# Patient Record
Sex: Male | Born: 1990 | Race: White | Hispanic: No | Marital: Single | Smoking: Current every day smoker
Health system: Southern US, Community
[De-identification: ages and names within clinical notes are randomized; demographics above are authoritative.]

## PROBLEM LIST (undated history)

## (undated) DIAGNOSIS — F319 Bipolar disorder, unspecified: Secondary | ICD-10-CM

## (undated) DIAGNOSIS — F909 Attention-deficit hyperactivity disorder, unspecified type: Secondary | ICD-10-CM

## (undated) DIAGNOSIS — F79 Unspecified intellectual disabilities: Secondary | ICD-10-CM

## (undated) HISTORY — PX: NO PAST SURGERIES: SHX2092

## (undated) HISTORY — PX: FOOT SURGERY: SHX648

---
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
## 2013-06-10 ENCOUNTER — Ambulatory Visit (HOSPITAL_COMMUNITY)
Admission: RE | Admit: 2013-06-10 | Discharge: 2013-06-10 | Disposition: A | Discharge status: Home / Self Care | Payer: Medicaid Other | Source: Ambulatory Visit | Attending: Internal Medicine | Admitting: Internal Medicine

## 2013-06-10 ENCOUNTER — Other Ambulatory Visit (HOSPITAL_COMMUNITY): Payer: Self-pay | Admitting: Internal Medicine

## 2013-06-10 DIAGNOSIS — M25569 Pain in unspecified knee: Secondary | ICD-10-CM | POA: Insufficient documentation

## 2013-10-03 ENCOUNTER — Emergency Department (HOSPITAL_COMMUNITY)
Admission: EM | Admit: 2013-10-03 | Discharge: 2013-10-03 | Disposition: A | Discharge status: Home / Self Care | Payer: Medicaid Other | Attending: Emergency Medicine | Admitting: Emergency Medicine

## 2013-10-03 DIAGNOSIS — Y9289 Other specified places as the place of occurrence of the external cause: Secondary | ICD-10-CM | POA: Insufficient documentation

## 2013-10-03 DIAGNOSIS — W1809XA Striking against other object with subsequent fall, initial encounter: Secondary | ICD-10-CM | POA: Insufficient documentation

## 2013-10-03 DIAGNOSIS — S0510XA Contusion of eyeball and orbital tissues, unspecified eye, initial encounter: Secondary | ICD-10-CM | POA: Diagnosis present

## 2013-10-03 DIAGNOSIS — S058X9A Other injuries of unspecified eye and orbit, initial encounter: Secondary | ICD-10-CM | POA: Diagnosis not present

## 2013-10-03 DIAGNOSIS — S0502XA Injury of conjunctiva and corneal abrasion without foreign body, left eye, initial encounter: Secondary | ICD-10-CM

## 2013-10-03 DIAGNOSIS — R51 Headache: Secondary | ICD-10-CM | POA: Diagnosis not present

## 2013-10-03 DIAGNOSIS — H1130 Conjunctival hemorrhage, unspecified eye: Secondary | ICD-10-CM | POA: Insufficient documentation

## 2013-10-03 DIAGNOSIS — H1132 Conjunctival hemorrhage, left eye: Secondary | ICD-10-CM

## 2013-10-03 DIAGNOSIS — Y9302 Activity, running: Secondary | ICD-10-CM | POA: Diagnosis not present

## 2013-10-03 MED ORDER — OXYCODONE-ACETAMINOPHEN 5-325 MG PO TABS: 2.0000 | ORAL_TABLET | Freq: Once | ORAL | Status: DC

## 2013-10-03 MED ORDER — TRAMADOL HCL 50 MG PO TABS: 50.0000 mg | ORAL_TABLET | Freq: Four times a day (QID) | ORAL | Status: DC | PRN

## 2013-10-03 MED ORDER — TETRACAINE HCL 0.5 % OP SOLN
2.0000 [drp] | Freq: Once | OPHTHALMIC | Status: AC
  Administered 2013-10-03: 2 [drp] via OPHTHALMIC
  Filled 2013-10-03: qty 2

## 2013-10-03 MED ORDER — FLUORESCEIN SODIUM 1 MG OP STRP
1.0000 | ORAL_STRIP | Freq: Once | OPHTHALMIC | Status: AC
  Administered 2013-10-03: 1 via OPHTHALMIC
  Filled 2013-10-03: qty 1

## 2013-10-03 MED ORDER — OXYCODONE-ACETAMINOPHEN 5-325 MG PO TABS
1.0000 | ORAL_TABLET | Freq: Once | ORAL | Status: AC
  Administered 2013-10-03: 1 via ORAL
  Filled 2013-10-03: qty 1

## 2013-10-03 MED ORDER — ERYTHROMYCIN 5 MG/GM OP OINT: TOPICAL_OINTMENT | Freq: Three times a day (TID) | OPHTHALMIC | Status: DC

## 2013-10-03 NOTE — ED Provider Notes (Signed)
CSN: [id number]     Arrival date & time 10/03/13  1223 History  This chart was scribed for non-physician practitioner, Junius Finner, PA-C,working with Gerhard Munch, MD, by Karle Plumber, ED Scribe.  This patient was seen in room TR04C/TR04C and the patient's care was started at 12:36 PM.   Chief Complaint  Patient presents with  . Eye Injury   The history is provided by the patient. No language interpreter was used.   HPI Comments:  Mark [name] is a 23 y.o. male who presents to the Emergency Department complaining of a left eye injury secondary to running into the corner of a car door while washing it PTA. Pt states he fell to the ground. He reports associated aching HA and photophobia of the left eye. He states the pain is not severe. He denies LOC, loss of vision or epistaxis. He denies any allergies to any medications.   No past medical history on file. No past surgical history on file. No family history on file. History  Substance Use Topics  . Smoking status: Not on file  . Smokeless tobacco: Not on file  . Alcohol Use: Not on file    Review of Systems  HENT: Negative for nosebleeds.   Eyes: Positive for photophobia and redness. Negative for visual disturbance.  Neurological: Positive for headaches. Negative for syncope.  All other systems reviewed and are negative.   Allergies  Review of patient's allergies indicates no known allergies.  Home Medications   Prior to Admission medications   Medication Sig Start Date End Date Taking? Authorizing Provider  erythromycin ophthalmic ointment Place into the left eye 3 (three) times daily. Place a 1/2 inch ribbon of ointment into the lower eyelid. For 5 days 10/03/13   Junius Finner, PA-C  traMADol (ULTRAM) 50 MG tablet Take 1 tablet (50 mg total) by mouth every 6 (six) hours as needed. 10/03/13   Junius Finner, PA-C   Triage Vitals: BP 145/72  Pulse 112  Temp(Src) 98.9 F (37.2 C) (Oral)  SpO2 99% Physical Exam   Nursing note and vitals reviewed. Constitutional: He is oriented to person, place, and time. He appears well-developed and well-nourished.  HENT:  Head: Normocephalic and atraumatic.  Eyes: EOM and lids are normal. Pupils are equal, round, and reactive to light. No foreign body present in the left eye. Left conjunctiva has a hemorrhage.  Slit lamp exam:      The left eye shows corneal abrasion and fluorescein uptake. The left eye shows no foreign body.  Neck: Normal range of motion.  Cardiovascular: Normal rate.   Pulmonary/Chest: Effort normal.  Musculoskeletal: Normal range of motion.  Neurological: He is alert and oriented to person, place, and time.  Skin: Skin is warm and dry.  Psychiatric: He has a normal mood and affect. His behavior is normal.    ED Course  Procedures (including critical care time) DIAGNOSTIC STUDIES: Oxygen Saturation is 99% on RA, normal by my interpretation.   COORDINATION OF CARE: 12:38 PM- Will speak with Dr. Jeraldine Loots to determine if imaging is necessary. Pt verbalizes understanding and agrees to plan.  1:38 PM- Spoke with Dr. Dione Booze and he advised to fluorescein dye the eye and check for abrasion/foreign body and then have the pt follow up with him on Monday.  Medications  tetracaine (PONTOCAINE) 0.5 % ophthalmic solution 2 drop (2 drops Left Eye Given 10/03/13 1237)  fluorescein ophthalmic strip 1 strip (1 strip Left Eye Given 10/03/13 1237)  oxyCODONE-acetaminophen (PERCOCET/ROXICET) 5-325  MG per tablet 1 tablet (1 tablet Oral Given 10/03/13 1316)    Labs Review Labs Reviewed - No data to display  Imaging Review No results found.   EKG Interpretation None      MDM   Final diagnoses:  Subconjunctival hemorrhage of left eye  Corneal abrasion, left, initial encounter    Pt presenting to ED with c/o left eye pain after hitting on open window of a car about 20min PTA.  Visual acuity: 20/30 bilaterally.  Pt does wear glasses.  PERRL, EOM in  tact.  subconjunctivial hemorrhage with chemosis, no hyphema.   Discussed pt with Dr. Jeraldine LootsLockwood who also examined pt.  Will consult with ophthalmology to ensure.  1:38 PM- Spoke with Dr. Dione BoozeGroat and he advised to fluorescein dye the eye and check for abrasion/foreign body and then have the pt follow up with him on Monday.  Corneal abrasion present but no leaking of blood or eye fluid.  Will tx with erythromycin ophthalmic ointment. Advised to call to schedule f/u appointment next week with Dr. Dione BoozeGroat. Return precautions provided. Pt verbalized understanding and agreement with tx plan.   I personally performed the services described in this documentation, which was scribed in my presence. The recorded information has been reviewed and is accurate.     Junius Finnerrin O'Malley, PA-C 10/03/13 1447

## 2013-10-03 NOTE — ED Notes (Signed)
Pt reports he ran into a open car door and the pointed corner hit him in the eye. Eye red and swollen

## 2013-10-03 NOTE — ED Provider Notes (Signed)
  This was a shared visit with a mid-level provided (NP or PA).  Throughout the patient's course I was available for consultation/collaboration.    On my exam the patient was in no distress.  He had consistent visual acuity, which is diminished at baseline.  There was evidence for a right chemosis, though no evidence of hyphema.  There was also evidence of mild corneal abrasion, which the patient was started on erythromycin      Gerhard Munchobert Hevin Jeffcoat, MD 10/03/13 [phone]

## 2013-10-03 NOTE — ED Notes (Signed)
Declined W/C at D/C and was escorted to lobby by RN. 

## 2014-04-24 ENCOUNTER — Emergency Department (HOSPITAL_COMMUNITY)
Admission: EM | Admit: 2014-04-24 | Discharge: 2014-04-24 | Disposition: A | Discharge status: Home / Self Care | Payer: Medicaid Other | Attending: Emergency Medicine | Admitting: Emergency Medicine

## 2014-04-24 ENCOUNTER — Encounter (HOSPITAL_COMMUNITY): Payer: Self-pay | Admitting: Emergency Medicine

## 2014-04-24 DIAGNOSIS — Z792 Long term (current) use of antibiotics: Secondary | ICD-10-CM | POA: Insufficient documentation

## 2014-04-24 DIAGNOSIS — Z8659 Personal history of other mental and behavioral disorders: Secondary | ICD-10-CM | POA: Insufficient documentation

## 2014-04-24 DIAGNOSIS — Z720 Tobacco use: Secondary | ICD-10-CM | POA: Insufficient documentation

## 2014-04-24 DIAGNOSIS — L509 Urticaria, unspecified: Secondary | ICD-10-CM | POA: Diagnosis present

## 2014-04-24 DIAGNOSIS — R21 Rash and other nonspecific skin eruption: Secondary | ICD-10-CM | POA: Insufficient documentation

## 2014-04-24 HISTORY — DX: Unspecified intellectual disabilities: F79

## 2014-04-24 HISTORY — DX: Bipolar disorder, unspecified: F31.9

## 2014-04-24 HISTORY — DX: Attention-deficit hyperactivity disorder, unspecified type: F90.9

## 2014-04-24 MED ORDER — DIPHENHYDRAMINE HCL 25 MG PO CAPS
25.0000 mg | ORAL_CAPSULE | Freq: Once | ORAL | Status: AC
  Administered 2014-04-24: 25 mg via ORAL
  Filled 2014-04-24: qty 1

## 2014-04-24 NOTE — ED Notes (Addendum)
Pt. reports generalized itchy skin rashes/hives onset this evening after eating chinese food , airway intact/ respirations unlabored .

## 2014-04-24 NOTE — ED Provider Notes (Signed)
CSN: [id number]     Arrival date & time 04/24/14  2036 History  This chart was scribed for non-physician practitioner, Mark Forth, PA-C, working with Mark Porter, MD, by Mark [name], ED Scribe. This patient was seen in room TR08C/TR08C and the patient's care was started at 9:32 PM.   Chief Complaint  Patient presents with  . Urticaria    The history is provided by the patient and medical records. No language interpreter was used.     HPI Comments: Mark [name] is a 24 y.o. male who presents to the Emergency Department complaining of gradually improving itchy, red rash over the entire body with onset 2-3 hours ago, after eating chinese food. Patient states he ate chinese food approximately 1-2 hours prior to breaking out in the rash and suspects this is to be the cause. He reports his symptoms are now subsiding at this time. He denies exposure to new soaps, detergents, colognes, or lotions. Patient denies history of allergies. He further denies SOB, wheezing, or throat swelling.    Past Medical History  Diagnosis Date  . ADHD (attention deficit hyperactivity disorder)   . Bipolar 1 disorder   . Mental retardation    History reviewed. No pertinent past surgical history. No family history on file. History  Substance Use Topics  . Smoking status: Current Every Day Smoker  . Smokeless tobacco: Not on file  . Alcohol Use: No    Review of Systems  Constitutional: Negative for fever, diaphoresis, appetite change, fatigue and unexpected weight change.  HENT: Negative for facial swelling, mouth sores and trouble swallowing.   Eyes: Negative for visual disturbance.  Respiratory: Negative for cough, chest tightness, shortness of breath and wheezing.   Cardiovascular: Negative for chest pain.  Gastrointestinal: Negative for nausea, vomiting, abdominal pain, diarrhea and constipation.  Musculoskeletal: Negative for back pain.  Skin: Positive for color change and rash.   Allergic/Immunologic: Negative for immunocompromised state.  Neurological: Negative for syncope, light-headedness and headaches.  Hematological: Does not bruise/bleed easily.  Psychiatric/Behavioral: Negative for sleep disturbance. The patient is not nervous/anxious.       Allergies  Review of patient's allergies indicates no known allergies.  Home Medications   Prior to Admission medications   Medication Sig Start Date End Date Taking? Authorizing Provider  erythromycin ophthalmic ointment Place into the left eye 3 (three) times daily. Place a 1/2 inch ribbon of ointment into the lower eyelid. For 5 days 10/03/13   Mark Finner, PA-C  traMADol (ULTRAM) 50 MG tablet Take 1 tablet (50 mg total) by mouth every 6 (six) hours as needed. 10/03/13   Mark Finner, PA-C   Triage Vitals: BP 131/73 mmHg  Pulse 117  Temp(Src) 97.9 F (36.6 C) (Oral)  Resp 18  SpO2 97%  Physical Exam  Constitutional: He is oriented to person, place, and time. He appears well-developed and well-nourished. No distress.  HENT:  Head: Normocephalic and atraumatic.  Right Ear: Tympanic membrane, external ear and ear canal normal.  Left Ear: Tympanic membrane, external ear and ear canal normal.  Nose: Nose normal. No mucosal edema or rhinorrhea.  Mouth/Throat: Uvula is midline. No uvula swelling. No oropharyngeal exudate, posterior oropharyngeal edema, posterior oropharyngeal erythema or tonsillar abscesses.  No swelling of the uvula or oropharynx   Eyes: Conjunctivae are normal.  Neck: Normal range of motion.  Patent airway No stridor; normal phonation Handling secretions without difficulty  Cardiovascular: Normal rate, normal heart sounds and intact distal pulses.   No murmur heard.  Pulmonary/Chest: Effort normal and breath sounds normal. No stridor. No respiratory distress. He has no wheezes.  No wheezes or rhonchi  Abdominal: Soft. Bowel sounds are normal. There is no tenderness.  Musculoskeletal:  Normal range of motion. He exhibits no edema.  Neurological: He is alert and oriented to person, place, and time.  Skin: Skin is warm and dry. He is not diaphoretic.  No rash  Psychiatric: He has a normal mood and affect.  Nursing note and vitals reviewed.   ED Course  Procedures (including critical care time)  DIAGNOSTIC STUDIES: Oxygen Saturation is 97% on room air, adequate by my interpretation.    COORDINATION OF CARE: At 2139 Discussed treatment plan with patient which includes Benadryl. Patient agrees.   Labs Review Labs Reviewed - No data to display  Imaging Review No results found.   EKG Interpretation None      MDM   Final diagnoses:  Urticaria   Mark [name] presents for allergic reaction to Cong[place] food.  Pt is from a group home and chaperone reports a red, blotchy rash about 8PM.  On physical exam, pt is without rash anywhere on his body.  Pt given Benadryl 25mg  here in the ED.  Patient is hemodynamically stable, in no respiratory distress, and denies the feeling of throat closing. Pt has been advised to take OTC benadryl & return to the ED if they have a mod-severe allergic rxn (s/s including throat closing, difficulty breathing, swelling of lips face or tongue). Pt is to follow up with their PCP. Pt is agreeable with plan & verbalizes understanding.   I have personally reviewed patient's vitals, nursing note and any pertinent labs or imaging.  I performed an focused physical exam; undressed when appropriate .    It has been determined that no acute conditions requiring further emergency intervention are present at this time. The patient/guardian have been advised of the diagnosis and plan. I reviewed any labs and imaging including any potential incidental findings. We have discussed signs and symptoms that warrant return to the ED and they are listed in the discharge instructions.    Vital signs are stable at discharge.  Tachycardia in triage, but no tachycardia  on my exam.  BP 131/73 mmHg  Pulse 117  Temp(Src) 97.9 F (36.6 C) (Oral)  Resp 18  SpO2 97%   I personally performed the services described in this documentation, which was scribed in my presence. The recorded information has been reviewed and is accurate.       Dahlia ClientHannah Arvella Massingale, PA-C 04/25/14 0006  Mark PorterMark James, MD 04/25/14 2245

## 2014-04-24 NOTE — ED Notes (Signed)
Pt st's symptoms are subsiding at this time.  No swelling present, no resp. Problems .

## 2014-04-24 NOTE — Discharge Instructions (Signed)
1. Medications: Benadryl as needed if rash returns, usual home medications 2. Treatment: rest, drink plenty of fluids, take medications as prescribed 3. Follow Up: Please followup with your primary doctor in 3 days for discussion of your diagnoses and further evaluation after today's visit; if you do not have a primary care doctor use the resource guide provided to find one; followup with dermatology as needed; Return to the ER for difficulty breathing, return of allergic reaction or other concerning symptoms   Hives Hives are itchy, red, swollen areas of the skin. They can vary in size and location on your body. Hives can come and go for hours or several days (acute hives) or for several weeks (chronic hives). Hives do not spread from person to person (noncontagious). They may get worse with scratching, exercise, and emotional stress. CAUSES   Allergic reaction to food, additives, or drugs.  Infections, including the common cold.  Illness, such as vasculitis, lupus, or thyroid disease.  Exposure to sunlight, heat, or cold.  Exercise.  Stress.  Contact with chemicals. SYMPTOMS   Red or white swollen patches on the skin. The patches may change size, shape, and location quickly and repeatedly.  Itching.  Swelling of the hands, feet, and face. This may occur if hives develop deeper in the skin. DIAGNOSIS  Your caregiver can usually tell what is wrong by performing a physical exam. Skin or blood tests may also be done to determine the cause of your hives. In some cases, the cause cannot be determined. TREATMENT  Mild cases usually get better with medicines such as antihistamines. Severe cases may require an emergency epinephrine injection. If the cause of your hives is known, treatment includes avoiding that trigger.  HOME CARE INSTRUCTIONS   Avoid causes that trigger your hives.  Take antihistamines as directed by your caregiver to reduce the severity of your hives. Non-sedating or  low-sedating antihistamines are usually recommended. Do not drive while taking an antihistamine.  Take any other medicines prescribed for itching as directed by your caregiver.  Wear loose-fitting clothing.  Keep all follow-up appointments as directed by your caregiver. SEEK MEDICAL CARE IF:   You have persistent or severe itching that is not relieved with medicine.  You have painful or swollen joints. SEEK IMMEDIATE MEDICAL CARE IF:   You have a fever.  Your tongue or lips are swollen.  You have trouble breathing or swallowing.  You feel tightness in the throat or chest.  You have abdominal pain. These problems may be the first sign of a life-threatening allergic reaction. Call your local emergency services (911 in [place] MAKE SURE YOU:   Understand these instructions.  Will watch your condition.  Will get help right away if you are not doing well or get worse. Document Released: 02/27/2005 Document Revised: 03/04/2013 Document Reviewed: 05/23/2011 [facility] Patient Information 2015 [employer] [place]. This information is not intended to replace advice given to you by your health care provider. Make sure you discuss any questions you have with your health care provider.

## 2014-09-03 ENCOUNTER — Emergency Department (HOSPITAL_COMMUNITY): Payer: Medicaid Other

## 2014-09-03 ENCOUNTER — Inpatient Hospital Stay (HOSPITAL_COMMUNITY)
Admission: EM | Admit: 2014-09-03 | Discharge: 2014-09-04 | DRG: 948 | Disposition: A | Discharge status: Home / Self Care | Payer: Medicaid Other | Attending: Internal Medicine | Admitting: Internal Medicine

## 2014-09-03 ENCOUNTER — Encounter (HOSPITAL_COMMUNITY): Payer: Self-pay | Admitting: Cardiology

## 2014-09-03 DIAGNOSIS — R400 Somnolence: Secondary | ICD-10-CM | POA: Diagnosis not present

## 2014-09-03 DIAGNOSIS — R410 Disorientation, unspecified: Secondary | ICD-10-CM

## 2014-09-03 DIAGNOSIS — Z91018 Allergy to other foods: Secondary | ICD-10-CM

## 2014-09-03 DIAGNOSIS — F17210 Nicotine dependence, cigarettes, uncomplicated: Secondary | ICD-10-CM | POA: Diagnosis present

## 2014-09-03 DIAGNOSIS — R7989 Other specified abnormal findings of blood chemistry: Secondary | ICD-10-CM | POA: Diagnosis present

## 2014-09-03 DIAGNOSIS — E872 Acidosis: Secondary | ICD-10-CM | POA: Diagnosis not present

## 2014-09-03 DIAGNOSIS — F319 Bipolar disorder, unspecified: Secondary | ICD-10-CM | POA: Diagnosis present

## 2014-09-03 DIAGNOSIS — G9340 Encephalopathy, unspecified: Secondary | ICD-10-CM

## 2014-09-03 DIAGNOSIS — F71 Moderate intellectual disabilities: Secondary | ICD-10-CM | POA: Diagnosis present

## 2014-09-03 DIAGNOSIS — R4182 Altered mental status, unspecified: Principal | ICD-10-CM

## 2014-09-03 DIAGNOSIS — F909 Attention-deficit hyperactivity disorder, unspecified type: Secondary | ICD-10-CM | POA: Diagnosis present

## 2014-09-03 DIAGNOSIS — D72829 Elevated white blood cell count, unspecified: Secondary | ICD-10-CM | POA: Diagnosis present

## 2014-09-03 LAB — I-STAT CG4 LACTIC ACID, ED
Lactic Acid, Venous: 4.38 mmol/L (ref 0.5–2.0)
Lactic Acid, Venous: 4.57 mmol/L (ref 0.5–2.0)

## 2014-09-03 LAB — PROTIME-INR
INR: 1.21 (ref 0.00–1.49)
Prothrombin Time: 15.5 seconds — ABNORMAL HIGH (ref 11.6–15.2)

## 2014-09-03 LAB — URINALYSIS, ROUTINE W REFLEX MICROSCOPIC
Glucose, UA: NEGATIVE mg/dL
Hgb urine dipstick: NEGATIVE
Ketones, ur: 15 mg/dL — AB
Leukocytes, UA: NEGATIVE
Nitrite: NEGATIVE
Protein, ur: NEGATIVE mg/dL
Specific Gravity, Urine: 1.028 (ref 1.005–1.030)
Urobilinogen, UA: 1 mg/dL (ref 0.0–1.0)
pH: 6 (ref 5.0–8.0)

## 2014-09-03 LAB — RAPID URINE DRUG SCREEN, HOSP PERFORMED
Amphetamines: NOT DETECTED
Barbiturates: NOT DETECTED
Benzodiazepines: NOT DETECTED
Cocaine: NOT DETECTED
Opiates: NOT DETECTED
Tetrahydrocannabinol: POSITIVE — AB

## 2014-09-03 LAB — COMPREHENSIVE METABOLIC PANEL
ALT: 29 U/L (ref 17–63)
AST: 26 U/L (ref 15–41)
Albumin: 3.9 g/dL (ref 3.5–5.0)
Alkaline Phosphatase: 49 U/L (ref 38–126)
Anion gap: 11 (ref 5–15)
BUN: 11 mg/dL (ref 6–20)
CO2: 20 mmol/L — ABNORMAL LOW (ref 22–32)
Calcium: 9.5 mg/dL (ref 8.9–10.3)
Chloride: 107 mmol/L (ref 101–111)
Creatinine, Ser: 0.97 mg/dL (ref 0.61–1.24)
GFR calc Af Amer: >60 mL/min (ref >60)
GFR calc non Af Amer: >60 mL/min (ref >60)
Glucose, Bld: 179 mg/dL — ABNORMAL HIGH (ref 65–99)
Potassium: 3.5 mmol/L (ref 3.5–5.1)
Sodium: 138 mmol/L (ref 135–145)
Total Bilirubin: 0.5 mg/dL (ref 0.3–1.2)
Total Protein: 7 g/dL (ref 6.5–8.1)

## 2014-09-03 LAB — CBC WITH DIFFERENTIAL/PLATELET
Basophils Absolute: 0.1 10*3/uL (ref 0.0–0.1)
Basophils Relative: 1 % (ref 0–1)
Eosinophils Absolute: 0.2 10*3/uL (ref 0.0–0.7)
Eosinophils Relative: 2 % (ref 0–5)
HCT: 48.9 % (ref 39.0–52.0)
Hemoglobin: 17.2 g/dL — ABNORMAL HIGH (ref 13.0–17.0)
Lymphocytes Relative: 30 % (ref 12–46)
Lymphs Abs: 3.8 10*3/uL (ref 0.7–4.0)
MCH: 31.2 pg (ref 26.0–34.0)
MCHC: 35.2 g/dL (ref 30.0–36.0)
MCV: 88.7 fL (ref 78.0–100.0)
Monocytes Absolute: 1.1 10*3/uL — ABNORMAL HIGH (ref 0.1–1.0)
Monocytes Relative: 9 % (ref 3–12)
Neutro Abs: 7.6 10*3/uL (ref 1.7–7.7)
Neutrophils Relative %: 58 % (ref 43–77)
Platelets: 233 10*3/uL (ref 150–400)
RBC: 5.51 MIL/uL (ref 4.22–5.81)
RDW: 12.5 % (ref 11.5–15.5)
WBC: 12.8 10*3/uL — ABNORMAL HIGH (ref 4.0–10.5)

## 2014-09-03 LAB — TSH
TSH: 0.954 u[IU]/mL (ref 0.350–4.500)
TSH: 0.713 u[IU]/mL (ref 0.350–4.500)

## 2014-09-03 LAB — AMMONIA
Ammonia: 49 umol/L — ABNORMAL HIGH (ref 9–35)
Ammonia: 46 umol/L — ABNORMAL HIGH (ref 9–35)

## 2014-09-03 LAB — ETHANOL: Alcohol, Ethyl (B): <5 mg/dL (ref <5)

## 2014-09-03 LAB — I-STAT TROPONIN, ED: Troponin i, poc: 0 ng/mL (ref 0.00–0.08)

## 2014-09-03 LAB — MRSA PCR SCREENING: MRSA by PCR: NEGATIVE

## 2014-09-03 LAB — VALPROIC ACID LEVEL: Valproic Acid Lvl: 93 ug/mL (ref 50.0–100.0)

## 2014-09-03 MED ORDER — SODIUM CHLORIDE 0.9 % IV BOLUS (SEPSIS)
1000.0000 mL | Freq: Once | INTRAVENOUS | Status: AC
  Administered 2014-09-03: 1000 mL via INTRAVENOUS

## 2014-09-03 MED ORDER — DEXTROSE 5 % IV SOLN
10.0000 mg/kg | Freq: Once | INTRAVENOUS | Status: AC
  Administered 2014-09-03: 820 mg via INTRAVENOUS
  Filled 2014-09-03: qty 16.4

## 2014-09-03 MED ORDER — ACETAMINOPHEN 650 MG RE SUPP: 650.0000 mg | Freq: Four times a day (QID) | RECTAL | Status: DC | PRN

## 2014-09-03 MED ORDER — CEFTRIAXONE SODIUM IN DEXTROSE 40 MG/ML IV SOLN
2.0000 g | Freq: Two times a day (BID) | INTRAVENOUS | Status: DC
Start: 2014-09-04 — End: 2014-09-04
  Administered 2014-09-04: 2 g via INTRAVENOUS
  Filled 2014-09-03 (×3): qty 50

## 2014-09-03 MED ORDER — VANCOMYCIN HCL 10 G IV SOLR
2000.0000 mg | Freq: Once | INTRAVENOUS | Status: AC
  Administered 2014-09-03: 2000 mg via INTRAVENOUS
  Filled 2014-09-03: qty 2000

## 2014-09-03 MED ORDER — DEXAMETHASONE SODIUM PHOSPHATE 10 MG/ML IJ SOLN
15.0000 mg | Freq: Once | INTRAMUSCULAR | Status: AC
  Administered 2014-09-03: 15 mg via INTRAVENOUS
  Filled 2014-09-03: qty 2

## 2014-09-03 MED ORDER — ONDANSETRON HCL 4 MG/2ML IJ SOLN: 4.0000 mg | Freq: Four times a day (QID) | INTRAMUSCULAR | Status: DC | PRN

## 2014-09-03 MED ORDER — LIDOCAINE HCL (PF) 1 % IJ SOLN
INTRAMUSCULAR | Status: AC
  Administered 2014-09-03: 16:00:00
  Filled 2014-09-03: qty 5

## 2014-09-03 MED ORDER — SODIUM CHLORIDE 0.9 % IJ SOLN: 3.0000 mL | Freq: Two times a day (BID) | INTRAMUSCULAR | Status: DC

## 2014-09-03 MED ORDER — VANCOMYCIN HCL IN DEXTROSE 1-5 GM/200ML-% IV SOLN
1000.0000 mg | Freq: Three times a day (TID) | INTRAVENOUS | Status: DC
  Administered 2014-09-03 – 2014-09-04 (×2): 1000 mg via INTRAVENOUS
  Filled 2014-09-03 (×5): qty 200

## 2014-09-03 MED ORDER — SODIUM CHLORIDE 0.9 % IV SOLN
INTRAVENOUS | Status: DC
  Administered 2014-09-03: 22:00:00 via INTRAVENOUS

## 2014-09-03 MED ORDER — ONDANSETRON HCL 4 MG PO TABS: 4.0000 mg | ORAL_TABLET | Freq: Four times a day (QID) | ORAL | Status: DC | PRN

## 2014-09-03 MED ORDER — ACETAMINOPHEN 325 MG PO TABS: 650.0000 mg | ORAL_TABLET | Freq: Four times a day (QID) | ORAL | Status: DC | PRN

## 2014-09-03 MED ORDER — LORAZEPAM 2 MG/ML IJ SOLN
1.0000 mg | Freq: Once | INTRAMUSCULAR | Status: AC
  Administered 2014-09-03: 1 mg via INTRAVENOUS
  Filled 2014-09-03: qty 1

## 2014-09-03 MED ORDER — HYDROCODONE-ACETAMINOPHEN 5-325 MG PO TABS: 1.0000 | ORAL_TABLET | ORAL | Status: DC | PRN

## 2014-09-03 MED ORDER — CEFTRIAXONE SODIUM 2 G IJ SOLR
2.0000 g | Freq: Once | INTRAMUSCULAR | Status: AC
  Administered 2014-09-03: 2 g via INTRAVENOUS
  Filled 2014-09-03: qty 2

## 2014-09-03 MED ORDER — LIDOCAINE-PRILOCAINE 2.5-2.5 % EX CREA
TOPICAL_CREAM | Freq: Once | CUTANEOUS | Status: AC
  Administered 2014-09-03: 1 via TOPICAL
  Filled 2014-09-03: qty 5

## 2014-09-03 NOTE — ED Notes (Signed)
NOTIFIED DR.GLICK FOR  PATIENTS LAB RESULTS OFCG4+LACTIC ACID @16 :57 .

## 2014-09-03 NOTE — Progress Notes (Signed)
EEG completed; results pending.    

## 2014-09-03 NOTE — Consult Note (Signed)
NEURO HOSPITALIST CONSULT NOTE    Reason for Consult: AMS  HPI:                                                                                                                                          Mark [name] is an 24 y.o. male presenting to ED from a group home. Patient was noted to be confused and lethargic today and brought to ED. Much of history is hard to obtain due to patient being severely lethargic and unable to provide information.  He states he has back and head pain. During interview he continues to fall asleep and at time talking  nonsensical. He states he is on Depakote and Clonazepam--which are not in his record, but labs do show he is thearapeutic on Depakote. He denies taking extra medication or medications that are not his.  He denies drug use but is THC positive.  Denies HA, vertigo, double vision, slurred speech, language or vision difficulty.  Per EMS sheet he is on Clonidine, Neurontin, Trazodone, Sertraline, Topiromate, Depakote, Clonazepam,  Past Medical History  Diagnosis Date  . ADHD (attention deficit hyperactivity disorder)   . Bipolar 1 disorder   . Mental retardation     History reviewed. No pertinent past surgical history.  Family History  Problem Relation Age of Onset  . Hypertension Mother   . Hyperlipidemia Mother      Social History:  reports that he has been smoking.  He does not have any smokeless tobacco history on file. He reports that he does not drink alcohol or use illicit drugs.  No Known Allergies  MEDICATIONS:                                                                                                                     Current Facility-Administered Medications  Medication Dose Route Frequency Provider Last Rate Last Dose  . acyclovir (ZOVIRAX) 820 mg in dextrose 5 % 150 mL IVPB  10 mg/kg (Ideal) Intravenous Once Tilden Fossa, MD 166.4 mL/hr at 09/03/14 1255 820 mg at 09/03/14 1255  . cefTRIAXone  (ROCEPHIN) 2 g in dextrose 5 % 50 mL IVPB  2 g Intravenous Once Tilden Fossa, MD       Current Outpatient Prescriptions  Medication Sig Dispense Refill  .  erythromycin ophthalmic ointment Place into the left eye 3 (three) times daily. Place a 1/2 inch ribbon of ointment into the lower eyelid. For 5 days 1 g 0  . traMADol (ULTRAM) 50 MG tablet Take 1 tablet (50 mg total) by mouth every 6 (six) hours as needed. 15 tablet 0      ROS:                                                                                                                                       History obtained from the patient  General ROS: negative for - chills, fatigue, fever, night sweats, weight gain or weight loss Psychological ROS: negative for - behavioral disorder, hallucinations, memory difficulties, mood swings or suicidal ideation Ophthalmic ROS: negative for - blurry vision, double vision, eye pain or loss of vision ENT ROS: negative for - epistaxis, nasal discharge, oral lesions, sore throat, tinnitus or vertigo Allergy and Immunology ROS: negative for - hives or itchy/watery eyes Hematological and Lymphatic ROS: negative for - bleeding problems, bruising or swollen lymph nodes Endocrine ROS: negative for - galactorrhea, hair pattern changes, polydipsia/polyuria or temperature intolerance Respiratory ROS: negative for - cough, hemoptysis, shortness of breath or wheezing Cardiovascular ROS: negative for - chest pain, dyspnea on exertion, edema or irregular heartbeat Gastrointestinal ROS: negative for - abdominal pain, diarrhea, hematemesis, nausea/vomiting or stool incontinence Genito-Urinary ROS: negative for - dysuria, hematuria, incontinence or urinary frequency/urgency Musculoskeletal ROS: negative for - joint swelling or muscular weakness Neurological ROS: as noted in HPI Dermatological ROS: negative for rash and skin lesion changes   Blood pressure 106/68, pulse 105, temperature 98.8 F (37.1 C),  temperature source Rectal, resp. rate 25, height  (1.88 m), weight 99.791 kg (220 lb), SpO2 99 %.   Physical Examination:                                                                                                      HEENT-  Normocephalic, no lesions, without obvious abnormality.  Normal external eye and conjunctiva.  Normal TM's bilaterally.  Normal auditory canals and external ears. Normal external nose, mucus membranes and septum.  Normal pharynx. Cardiovascular- S1, S2 normal, pulses palpable throughout   Lungs- chest clear, no wheezing, rales, normal symmetric air entry Abdomen- normal findings: bowel sounds normal Extremities- no edema Lymph-no adenopathy palpable Musculoskeletal-no joint tenderness, deformity or swelling Skin-warm and dry, no hyperpigmentation, vitiligo, or suspicious lesions  Neurological Examination Mental Status: Alert, oriented, thought content not  appropriate due to lethargy.  At times will drift off to sleep and start talking nonsensically. Speech fluent without evidence of aphasia.  Able to follow 3 step commands without difficulty. Cranial Nerves: II: Discs flat bilaterally; Visual fields grossly normal, pupils equal, round, reactive to light and accommodation III,IV, VI: ptosis not present, extra-ocular motions intact bilaterally--do to sleepy state he is having hard time keeping his eyes open.  V,VII: smile symmetric, facial light touch sensation normal bilaterally VIII: hearing normal bilaterally IX,X: uvula rises symmetrically XI: bilateral shoulder shrug XII: midline tongue extension Motor: Moving all extremities antigravity Sensory: Pinprick and light touch intact throughout, bilaterally Deep Tendon Reflexes: 1+ and symmetric throughout Plantars: Right: downgoing   Left: downgoing Cerebellar: normal finger-to-nose,  and normal heel-to-shin test Gait: not tested due to drowsiness No meningeal signs.      Lab Results: Basic  Metabolic Panel:  Recent Labs Lab 09/03/14 1043  NA 138  K 3.5  CL 107  CO2 20*  GLUCOSE 179*  BUN 11  CREATININE 0.97  CALCIUM 9.5    Liver Function Tests:  Recent Labs Lab 09/03/14 1043  AST 26  ALT 29  ALKPHOS 49  BILITOT 0.5  PROT 7.0  ALBUMIN 3.9   No results for input(s): LIPASE, AMYLASE in the last 168 hours. No results for input(s): AMMONIA in the last 168 hours.  CBC:  Recent Labs Lab 09/03/14 1043  WBC 12.8*  NEUTROABS 7.6  HGB 17.2*  HCT 48.9  MCV 88.7  PLT 233    Cardiac Enzymes: No results for input(s): CKTOTAL, CKMB, CKMBINDEX, TROPONINI in the last 168 hours.  Lipid Panel: No results for input(s): CHOL, TRIG, HDL, CHOLHDL, VLDL, LDLCALC in the last 168 hours.  CBG: No results for input(s): GLUCAP in the last 168 hours.  Microbiology: No results found for this or any previous visit.  Coagulation Studies: No results for input(s): LABPROT, INR in the last 72 hours.  Imaging: Ct Head Wo Contrast  09/03/2014   CLINICAL DATA:  Lethargy, history mental retardation, bipolar disorder, ADHD, responsive to verbal and painful stimuli, smoker  EXAM: CT HEAD WITHOUT CONTRAST  TECHNIQUE: Contiguous axial images were obtained from the base of the skull through the vertex without intravenous contrast.  COMPARISON:  None  FINDINGS: Normal ventricular morphology.  No midline shift or mass effect.  Normal appearance of brain parenchyma.  No intracranial hemorrhage, mass lesion, or acute infarction.  Visualized paranasal sinuses and mastoid air cells clear.  Bones unremarkable.  IMPRESSION: Normal exam.   Electronically Signed   By: Ulyses Southward M.D.   On: 09/03/2014 11:21   Dg Chest Port 1 View  09/03/2014   CLINICAL DATA:  Altered mental status.  EXAM: PORTABLE CHEST - 1 VIEW  COMPARISON:  None.  FINDINGS: The cardiomediastinal silhouette is within normal limits. The lungs are mildly hypoinflated. No confluent airspace opacity, edema, pleural effusion, or  pneumothorax is identified. No acute osseous abnormality is seen.  IMPRESSION: Mild hypoinflation.  No evidence of acute airspace disease.   Electronically Signed   By: Sebastian Ache   On: 09/03/2014 11:23       Assessment and plan per attending neurologist  Felicie Morn PA-C [facility] [phone]  09/03/2014, 1:50 PM   Assessment/Plan:  24 YO male presenting to ED with AMS. Etiology is unclear at this time, but he seems encephalopathic.  WBC is mildly elevated at 12.3 but he is Afebrile and has no meningeal irritation signs. Exam is non-focal. UA  normal, UDS positive for THC.   Recommend: 1) MRI brain wo contrast 2) EEG 3) LP -to be done in ED and confirm ed with ED PA 4) ammonia, TSH 5) clarify the actual medications he is on as med rec and his report are different. --this could be result of OD or medication SE.   Patient seen and examined together with physician assistant and I concur with the assessment and plan.  Wyatt Portela, MD

## 2014-09-03 NOTE — ED Notes (Signed)
MD at bedside. 

## 2014-09-03 NOTE — ED Notes (Signed)
Pt still in radiology.

## 2014-09-03 NOTE — ED Provider Notes (Signed)
LUMBAR PUNCTURE Date/Time: 09/03/2014 4:15 PM Performed by: Lyndal Pulley Authorized by: Tilden Fossa Consent: The procedure was performed in an emergent situation. Risks and benefits discussed: patient unable to consent d/t AMS. Patient identity confirmed: verbally with patient, arm band, provided demographic data and hospital-assigned identification number Time out: Immediately prior to procedure a "time out" was called to verify the correct patient, procedure, equipment, support staff and site/side marked as required. Indications: evaluation for infection Anesthesia: local infiltration Local anesthetic: lidocaine 1% without epinephrine Anesthetic total: 10 ml Patient sedated: no Preparation: Patient was prepped and draped in the usual sterile fashion. Lumbar space: L4-L5 interspace Patient's position: sitting Needle gauge: 22 Needle type: spinal needle - Quincke tip Needle length: 3.5 in Number of attempts: 3 Fluid appearance: bloody Total volume: 0 ml Post-procedure: site cleaned Patient tolerance: Patient tolerated the procedure well with no immediate complications Comments: Landmarks identified, needle introduced, unable to return spinal fluid     Lyndal Pulley, MD 09/03/14 1616  Tilden Fossa, MD 09/03/14 1620

## 2014-09-03 NOTE — H&P (Signed)
[facility] History and Physical  [name]:[id number] DOB: 03-Jun-1990 DOA: 09/03/2014  Referring physician: EDP PCP: Dorrene German, MD   Chief Complaint: Confusion  HPI: Mark [name] is a 24 y.o. 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 Patient was in his usual state of health until this morning, was found to be lethargic, once sleep after he ate his breakfast and that is very unusual for him. Patient brought to the hospital for further evaluation, earlier patient was not able to wake up, but now he is able to communicate that he still is very poor historian. Patient denies any fever, nausea or chills, denies any cough or sputum production. In the ED seen by neurology and recommended to check ammonia, TSH, MRI and EEG. His urine tox is positive for THC, he denies any drugs abuse. Lumbar puncture tried by the ED physician but was unsuccessful, patient was sent to IR, tried twice but still unsuccessful.  Review of Systems:  Unable to obtain review of system because of lethargy. Although he started to wake up but he is still can't provide history  Past Medical History  Diagnosis Date  . ADHD (attention deficit hyperactivity disorder)   . Bipolar 1 disorder   . Mental retardation    History reviewed. No pertinent past surgical history. Social History:   reports that he has been smoking.  He does not have any smokeless tobacco history on file. He reports that he does not drink alcohol or use illicit drugs.  No Known Allergies  Family History  Problem Relation Age of Onset  . Hypertension Mother   . Hyperlipidemia Mother      Prior to Admission medications   Medication Sig Start Date End Date Taking? Authorizing Provider  erythromycin ophthalmic ointment Place into the left eye 3 (three) times daily. Place a 1/2 inch ribbon of ointment into the lower  eyelid. For 5 days 10/03/13   Junius Finner, PA-C  traMADol (ULTRAM) 50 MG tablet Take 1 tablet (50 mg total) by mouth every 6 (six) hours as needed. 10/03/13   Junius Finner, PA-C   Physical Exam: Filed Vitals:   09/03/14 1645  BP: 120/68  Pulse: 89  Temp:   Resp: 21   Constitutional: Lethargic, wakes up to verbal stimuli, attention span about 30 seconds. Well-developed and well-nourished. Cooperative.  Head: Normocephalic and atraumatic.  Nose: Nose normal.  Mouth/Throat: Uvula is midline, oropharynx is clear and moist and mucous membranes are normal.  Eyes: Conjunctivae and EOM are normal. Pupils are equal, round, and reactive to light.  Neck: Trachea normal and normal range of motion. Neck supple.  Cardiovascular: Normal rate, regular rhythm, S1 normal, S2 normal, normal heart sounds and intact distal pulses.   Pulmonary/Chest: Effort normal and breath sounds normal.  Abdominal: Soft. Bowel sounds are normal. There is no hepatosplenomegaly. There is no tenderness.  Musculoskeletal: Normal range of motion.  Neurological: Alert and oriented to person, place, and time. Has normal strength. No cranial nerve deficit or sensory deficit.  Skin: Skin is warm, dry and intact.  Psychiatric: Has a normal mood and affect. Speech is normal and behavior is normal.   Labs on Admission:  Basic Metabolic Panel:  Recent Labs Lab 09/03/14 1043  NA 138  K 3.5  CL 107  CO2 20*  GLUCOSE 179*  BUN 11  CREATININE 0.97  CALCIUM 9.5   Liver Function  Tests:  Recent Labs Lab 09/03/14 1043  AST 26  ALT 29  ALKPHOS 49  BILITOT 0.5  PROT 7.0  ALBUMIN 3.9   No results for input(s): LIPASE, AMYLASE in the last 168 hours. No results for input(s): AMMONIA in the last 168 hours. CBC:  Recent Labs Lab 09/03/14 1043  WBC 12.8*  NEUTROABS 7.6  HGB 17.2*  HCT 48.9  MCV 88.7  PLT 233   Cardiac Enzymes: No results for input(s): CKTOTAL, CKMB, CKMBINDEX, TROPONINI in the last 168  hours.  BNP (last 3 results) No results for input(s): BNP in the last 8760 hours.  ProBNP (last 3 results) No results for input(s): PROBNP in the last 8760 hours.  CBG: No results for input(s): GLUCAP in the last 168 hours.  Radiological Exams on Admission: Ct Head Wo Contrast  09/03/2014   CLINICAL DATA:  Lethargy, history mental retardation, bipolar disorder, ADHD, responsive to verbal and painful stimuli, smoker  EXAM: CT HEAD WITHOUT CONTRAST  TECHNIQUE: Contiguous axial images were obtained from the base of the skull through the vertex without intravenous contrast.  COMPARISON:  None  FINDINGS: Normal ventricular morphology.  No midline shift or mass effect.  Normal appearance of brain parenchyma.  No intracranial hemorrhage, mass lesion, or acute infarction.  Visualized paranasal sinuses and mastoid air cells clear.  Bones unremarkable.  IMPRESSION: Normal exam.   Electronically Signed   By: Ulyses Southward M.D.   On: 09/03/2014 11:21   Dg Chest Port 1 View  09/03/2014   CLINICAL DATA:  Altered mental status.  EXAM: PORTABLE CHEST - 1 VIEW  COMPARISON:  None.  FINDINGS: The cardiomediastinal silhouette is within normal limits. The lungs are mildly hypoinflated. No confluent airspace opacity, edema, pleural effusion, or pneumothorax is identified. No acute osseous abnormality is seen.  IMPRESSION: Mild hypoinflation.  No evidence of acute airspace disease.   Electronically Signed   By: Sebastian Ache   On: 09/03/2014 11:23    EKG: Independently reviewed.   Assessment/Plan Principal Problem:   Altered mental status Active Problems:   Elevated lactic acid level    Altered mental status Patient has altered mental status, has functional mental retardation. His poor historian, unable to provide much of medical history. Seen by neurology earlier today, recommended TSH, ammonia, EEG and MRI. Urine tox is positive for THC. Patient started to wake up and feel much better than before. Observe  overnight. Unclear etiology for altered mental status. Patient started on high-dose Rocephin empirically for meningitis, LP done once in the ED and twice in radiology but was unsuccessful.  Elevated lactic acid/leukocytosis Without fever or chills, lactic acid is elevated at 4.5. Differential includes infection and dehydration, started on IV fluids and antibiotics. Repeat lactic acid in a.m.   Code Status: Full code Family Communication: Plan discussed with the patient Disposition Plan: Observation, telemetry  Time spent: 70 minutes  Chavie Kolinski A, MD [facility] Pager [phone]

## 2014-09-03 NOTE — ED Notes (Signed)
EEG at the bedside. MRI to be called when pt is ready for transport.

## 2014-09-03 NOTE — ED Notes (Signed)
Contact information for the patient [facility] [address] [address] [phone]

## 2014-09-03 NOTE — ED Notes (Signed)
Cultures completed prior to antibiotics

## 2014-09-03 NOTE — ED Provider Notes (Signed)
CSN: [id number]     Arrival date & time 09/03/14  1029 History   First MD Initiated Contact with Patient 09/03/14 1051     Chief Complaint  Patient presents with  . Altered Mental Status      Patient is a 24 y.o. male presenting with altered mental status. The history is provided by the EMS personnel. No language interpreter was used.  Altered Mental Status  Mark [name] presents for evaluation of AMS.  Level V caveat due to altered mental status.  Hx is provided by EMS.  He was last seen normal last night.  This morning his therapist came in to see him and he was lethargic with decreased  Responsiveness.  No known drug ingestion.  No witnessed seizure.    Past Medical History  Diagnosis Date  . ADHD (attention deficit hyperactivity disorder)   . Bipolar 1 disorder   . Mental retardation    History reviewed. No pertinent past surgical history. History reviewed. No pertinent family history. History  Substance Use Topics  . Smoking status: Current Every Day Smoker  . Smokeless tobacco: Not on file  . Alcohol Use: No    Review of Systems  Unable to perform ROS     Allergies  Review of patient's allergies indicates no known allergies.  Home Medications   Prior to Admission medications   Medication Sig Start Date End Date Taking? Authorizing Provider  erythromycin ophthalmic ointment Place into the left eye 3 (three) times daily. Place a 1/2 inch ribbon of ointment into the lower eyelid. For 5 days 10/03/13   Junius Finner, PA-C  traMADol (ULTRAM) 50 MG tablet Take 1 tablet (50 mg total) by mouth every 6 (six) hours as needed. 10/03/13   Junius Finner, PA-C   BP 143/71 mmHg  Pulse 90  Temp(Src) 98.2 F (36.8 C) (Oral)  Resp 18  SpO2 96% Physical Exam  Constitutional: He appears well-developed and well-nourished.  HENT:  Head: Normocephalic and atraumatic.  Eyes: EOM are normal. Pupils are equal, round, and reactive to light.  Cardiovascular: Normal rate and regular  rhythm.   No murmur heard. Pulmonary/Chest: Effort normal and breath sounds normal. No respiratory distress.  Abdominal: Soft. There is no tenderness. There is no rebound and no guarding.  Musculoskeletal: He exhibits no edema or tenderness.  Neurological:  Lethargic with sonorous respirations.  Arouses to painful stimuli and loud verbal stimuli.  Mumbling speech.  Positive gag.  Rapidly falls back to sleep after awakening.    Skin: Skin is warm and dry.  Psychiatric:  Unable to assess  Nursing note and vitals reviewed.   ED Course  Procedures (including critical care time) Labs Review Labs Reviewed  COMPREHENSIVE METABOLIC PANEL - Abnormal; Notable for the following:    CO2 20 (*)    Glucose, Bld 179 (*)    All other components within normal limits  CBC WITH DIFFERENTIAL/PLATELET - Abnormal; Notable for the following:    WBC 12.8 (*)    Hemoglobin 17.2 (*)    Monocytes Absolute 1.1 (*)    All other components within normal limits  URINALYSIS, ROUTINE W REFLEX MICROSCOPIC (NOT AT [facility]) - Abnormal; Notable for the following:    Color, Urine AMBER (*)    Bilirubin Urine SMALL (*)    Ketones, ur 15 (*)    All other components within normal limits  URINE RAPID DRUG SCREEN, HOSP PERFORMED - Abnormal; Notable for the following:    Tetrahydrocannabinol POSITIVE (*)    All  other components within normal limits  I-STAT CG4 LACTIC ACID, ED - Abnormal; Notable for the following:    Lactic Acid, Venous 4.38 (*)    All other components within normal limits  URINE CULTURE  CULTURE, BLOOD (ROUTINE X 2)  CULTURE, BLOOD (ROUTINE X 2)  ETHANOL  VALPROIC ACID LEVEL  AMMONIA  TSH  CBG MONITORING, ED  I-STAT TROPOININ, ED    Imaging Review Ct Head Wo Contrast  09/03/2014   CLINICAL DATA:  Lethargy, history mental retardation, bipolar disorder, ADHD, responsive to verbal and painful stimuli, smoker  EXAM: CT HEAD WITHOUT CONTRAST  TECHNIQUE: Contiguous axial images were obtained from the  base of the skull through the vertex without intravenous contrast.  COMPARISON:  None  FINDINGS: Normal ventricular morphology.  No midline shift or mass effect.  Normal appearance of brain parenchyma.  No intracranial hemorrhage, mass lesion, or acute infarction.  Visualized paranasal sinuses and mastoid air cells clear.  Bones unremarkable.  IMPRESSION: Normal exam.   Electronically Signed   By: Ulyses Southward M.D.   On: 09/03/2014 11:21   Dg Chest Port 1 View  09/03/2014   CLINICAL DATA:  Altered mental status.  EXAM: PORTABLE CHEST - 1 VIEW  COMPARISON:  None.  FINDINGS: The cardiomediastinal silhouette is within normal limits. The lungs are mildly hypoinflated. No confluent airspace opacity, edema, pleural effusion, or pneumothorax is identified. No acute osseous abnormality is seen.  IMPRESSION: Mild hypoinflation.  No evidence of acute airspace disease.   Electronically Signed   By: Sebastian Ache   On: 09/03/2014 11:23     EKG Interpretation   Date/Time:  Thursday September 03 2014 10:35:15 EDT Ventricular Rate:  83 PR Interval:  146 QRS Duration: 97 QT Interval:  406 QTC Calculation: 477 R Axis:   74 Text Interpretation:  Sinus rhythm Minimal ST depression, lateral leads  Borderline prolonged QT interval Confirmed by Lincoln Brigham [phone]) on  09/03/2014 10:57:36 AM      MDM   Final diagnoses:  Altered mental status    Patient here for evaluation of altered mental status. On initial evaluation patient considerably lethargic but arousable. On repeat evaluation in ED patient appears confused but is less lethargic he does complain of feeling bad and hurting all over. Question if patient had a possible seizure earlier in the day. After patient had been in the emergency department for several hours Facility did call and stated he was at his baseline at 8 AM this morning and was rechecked later after saying he was tired and was noted to be minimally responsive. They do dispensed medications and no  feel like he would've had an overdose. He has no history of drug abuse that they are aware of. He has been a resident of the facility for the last 2 years. Attempted lumbar puncture at bedside under emergency consent to rule out meningitis, patient treated empirically. Patient care transferred pending LP.  Tilden Fossa, MD 09/03/14 [phone]

## 2014-09-03 NOTE — ED Notes (Signed)
MRI called to inquire if pt can have MRI completed before going to room, unable to at this time, will get pt from upstairs when they are ready.

## 2014-09-03 NOTE — ED Notes (Addendum)
Pt to department via EMS from group home- pt was found by staff to be lethargic but responsive to verbal and painful stimuli. Pt with hx of MR, Bipolar and ADHD. Pt is very lethargic, but pupils are not pin point. EMS started IV, no meds given in route. Bp-147/57 Hr-84 RR-18, but snoring. CBG-136

## 2014-09-03 NOTE — Procedures (Signed)
ELECTROENCEPHALOGRAM REPORT   Patient: Mark [name]       Room #: D35 EEG No. ID: [id number] Age: 24 y.o.        Sex: male Referring Physician: Arthor Captain Report Date:  09/03/2014        Interpreting Physician: Thana Farr  History: Mark [name] is an 24 y.o. male with altered mental status  Medications:  Scheduled:   Conditions of Recording:  This is a 16 channel EEG carried out with the patient in the awake and drowsy states.  Patient confused and uncooperative.  Description:  The waking background activity consists of a low voltage, symmetrical, fairly well organized but poorly sustained, 10 Hz alpha activity, seen from the parieto-occipital and posterior temporal regions.  Low voltage fast activity, poorly organized, is seen anteriorly and is at times superimposed on more posterior regions.  A mixture of theta and alpha rhythms are seen from the central and temporal regions. The patient drowses with slowing to irregular, low voltage theta and beta activity.   Stage II sleep is not obtained. Hyperventilation was not performed.  Intermittent photic stimulation was performed but failed to illicit any change in the tracing.  IMPRESSION: Normal electroencephalogram, awake, drowsy and with activation procedures. There are no focal lateralizing or epileptiform features.   Thana Farr, MD [facility] [phone] 09/03/2014, 6:13 PM

## 2014-09-03 NOTE — ED Notes (Signed)
Lab at the bedside for cultures 

## 2014-09-03 NOTE — Progress Notes (Addendum)
ANTIBIOTIC CONSULT NOTE - INITIAL  Pharmacy Consult for vancomycin Indication: meningitis  No Known Allergies  Patient Measurements: Height: 6\' 2"  (188 cm) Weight: 220 lb (99.791 kg) IBW/kg (Calculated) : 82.2   Vital Signs: Temp: 98.8 F (37.1 C) (06/23 1121) Temp Source: Rectal (06/23 1121) BP: 134/66 mmHg (06/23 1120) Pulse Rate: 89 (06/23 1120) Intake/Output from previous day:   Intake/Output from this shift:    Labs:  Recent Labs  09/03/14 1043  WBC 12.8*  HGB 17.2*  PLT 233  CREATININE 0.97   Estimated Creatinine Clearance: 149.4 mL/min (by C-G formula based on Cr of 0.97). No results for input(s): VANCOTROUGH, VANCOPEAK, VANCORANDOM, GENTTROUGH, GENTPEAK, GENTRANDOM, TOBRATROUGH, TOBRAPEAK, TOBRARND, AMIKACINPEAK, AMIKACINTROU, AMIKACIN in the last 72 hours.   Microbiology: No results found for this or any previous visit (from the past 720 hour(s)).  Medical History: Past Medical History  Diagnosis Date  . ADHD (attention deficit hyperactivity disorder)   . Bipolar 1 disorder   . Mental retardation     Assessment: 24 YOM seen in ED from group home with decreased responsiveness/AMS. Last seen normal last evening, no witnessed seizure. Blood cultures and urine cultures have been ordered. Lactic acid in ED elevated at 4.38. WBC 12.8, currently afebrile.  SCr 0.97  Acyclovir 10mg /kg (82mg  based on estimated ideal body weight) IV x1 and ceftriaxone 2g IV x1 have been ordered by EDP.  Goal of Therapy:  Vancomycin trough level 15-20 mcg/ml  Plan:  -vancomycin 2000mg  IV x1 as loading dose- 20mg /kg based on estimated weight -will follow up renal function, updated height and weight for maintenance doses -follow up for continuation of other antibiotics after 1 time doses  Lauren D. Bajbus, PharmD, BCPS Clinical Pharmacist Pager: [phone] 09/03/2014 12:04 PM  Addum:  Cont vanc 1 gm IV q8 hours Talbert Cage, PharmD

## 2014-09-03 NOTE — ED Notes (Signed)
Admitting MD at BS.  

## 2014-09-04 ENCOUNTER — Inpatient Hospital Stay (HOSPITAL_COMMUNITY): Payer: Medicaid Other

## 2014-09-04 DIAGNOSIS — E872 Acidosis: Secondary | ICD-10-CM | POA: Diagnosis not present

## 2014-09-04 DIAGNOSIS — R400 Somnolence: Secondary | ICD-10-CM

## 2014-09-04 DIAGNOSIS — R4182 Altered mental status, unspecified: Secondary | ICD-10-CM | POA: Diagnosis present

## 2014-09-04 DIAGNOSIS — F71 Moderate intellectual disabilities: Secondary | ICD-10-CM | POA: Diagnosis present

## 2014-09-04 DIAGNOSIS — F909 Attention-deficit hyperactivity disorder, unspecified type: Secondary | ICD-10-CM | POA: Diagnosis present

## 2014-09-04 DIAGNOSIS — F319 Bipolar disorder, unspecified: Secondary | ICD-10-CM | POA: Diagnosis present

## 2014-09-04 DIAGNOSIS — Z91018 Allergy to other foods: Secondary | ICD-10-CM | POA: Diagnosis not present

## 2014-09-04 DIAGNOSIS — F17210 Nicotine dependence, cigarettes, uncomplicated: Secondary | ICD-10-CM | POA: Diagnosis present

## 2014-09-04 DIAGNOSIS — D72829 Elevated white blood cell count, unspecified: Secondary | ICD-10-CM | POA: Diagnosis present

## 2014-09-04 LAB — BASIC METABOLIC PANEL
Anion gap: 14 (ref 5–15)
BUN: 15 mg/dL (ref 6–20)
CO2: 22 mmol/L (ref 22–32)
Calcium: 10.1 mg/dL (ref 8.9–10.3)
Chloride: 108 mmol/L (ref 101–111)
Creatinine, Ser: 1.13 mg/dL (ref 0.61–1.24)
GFR calc Af Amer: >60 mL/min (ref >60)
GFR calc non Af Amer: >60 mL/min (ref >60)
Glucose, Bld: 127 mg/dL — ABNORMAL HIGH (ref 65–99)
Potassium: 4.7 mmol/L (ref 3.5–5.1)
Sodium: 144 mmol/L (ref 135–145)

## 2014-09-04 LAB — URINE CULTURE: Culture: NO GROWTH

## 2014-09-04 LAB — CBC
HCT: 50.1 % (ref 39.0–52.0)
Hemoglobin: 17.2 g/dL — ABNORMAL HIGH (ref 13.0–17.0)
MCH: 30.3 pg (ref 26.0–34.0)
MCHC: 34.3 g/dL (ref 30.0–36.0)
MCV: 88.2 fL (ref 78.0–100.0)
Platelets: 220 10*3/uL (ref 150–400)
RBC: 5.68 MIL/uL (ref 4.22–5.81)
RDW: 12.7 % (ref 11.5–15.5)
WBC: 17 10*3/uL — ABNORMAL HIGH (ref 4.0–10.5)

## 2014-09-04 LAB — LACTIC ACID, PLASMA: Lactic Acid, Venous: 1.8 mmol/L (ref 0.5–2.0)

## 2014-09-04 LAB — PROCALCITONIN: Procalcitonin: <0.1 ng/mL

## 2014-09-04 MED ORDER — TRAZODONE HCL 100 MG PO TABS
100.0000 mg | ORAL_TABLET | Freq: Every day | ORAL | Status: DC
  Filled 2014-09-04: qty 1

## 2014-09-04 MED ORDER — TOPIRAMATE 100 MG PO TABS
100.0000 mg | ORAL_TABLET | Freq: Two times a day (BID) | ORAL | Status: DC
  Administered 2014-09-04: 100 mg via ORAL
  Filled 2014-09-04 (×2): qty 1

## 2014-09-04 MED ORDER — CEFDINIR 300 MG PO CAPS: 300.0000 mg | ORAL_CAPSULE | Freq: Two times a day (BID) | ORAL | Status: DC

## 2014-09-04 MED ORDER — SERTRALINE HCL 100 MG PO TABS
100.0000 mg | ORAL_TABLET | Freq: Every day | ORAL | Status: DC
  Administered 2014-09-04: 100 mg via ORAL
  Filled 2014-09-04: qty 1

## 2014-09-04 MED ORDER — GABAPENTIN 600 MG PO TABS
600.0000 mg | ORAL_TABLET | Freq: Two times a day (BID) | ORAL | Status: DC
  Administered 2014-09-04: 600 mg via ORAL
  Filled 2014-09-04 (×2): qty 1

## 2014-09-04 MED ORDER — CLONIDINE HCL 0.2 MG PO TABS
0.2000 mg | ORAL_TABLET | Freq: Every day | ORAL | Status: DC
  Filled 2014-09-04: qty 1

## 2014-09-04 MED ORDER — CLONAZEPAM 0.5 MG PO TABS
0.5000 mg | ORAL_TABLET | Freq: Two times a day (BID) | ORAL | Status: DC
  Administered 2014-09-04: 0.5 mg via ORAL
  Filled 2014-09-04: qty 1

## 2014-09-04 MED ORDER — CLONIDINE HCL 0.1 MG PO TABS: 0.0500 mg | ORAL_TABLET | Freq: Two times a day (BID) | ORAL | Status: DC

## 2014-09-04 MED ORDER — DIVALPROEX SODIUM ER 500 MG PO TB24
1500.0000 mg | ORAL_TABLET | Freq: Every day | ORAL | Status: DC
  Filled 2014-09-04: qty 3

## 2014-09-04 NOTE — Discharge Summary (Signed)
Discharge Summary  Mark [name] ZOX:[id number] DOB: 29-Apr-1990  PCP: Dorrene German, MD  Admit date: 09/03/2014 Discharge date: 09/04/2014  Time spent: >46mins  Recommendations for Outpatient Follow-up:  1. F/u with PMD within a week, pmd to repeat cbc/cmp/ammonia/lactic acid level. pmd to follow up with final culture results.  Discharge Diagnoses:  Active Hospital Problems   Diagnosis Date Noted  . Altered mental status 09/03/2014  . Elevated lactic acid level 09/03/2014    Resolved Hospital Problems   Diagnosis Date Noted Date Resolved  No resolved problems to display.    Discharge Condition: stable  Diet recommendation: heart healthy/carb modified  Filed Weights   09/03/14 1121 09/03/14 1847 09/03/14 2153  Weight: 99.791 kg (220 lb) 107.502 kg (237 lb) 108.863 kg (240 lb)    History of present illness:  Mark [name] is a 24 y.o. 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 Patient was in his usual state of health until this morning, was found to be lethargic, once sleep after he ate his breakfast and that is very unusual for him. Patient brought to the hospital for further evaluation, earlier patient was not able to wake up, but now he is able to communicate that he still is very poor historian. Patient denies any fever, nausea or chills, denies any cough or sputum production. In the ED seen by neurology and recommended to check ammonia, TSH, MRI and EEG. His urine tox is positive for THC, he denies any drugs abuse. Lumbar puncture tried by the ED physician but was unsuccessful, patient was sent to IR, tried twice but still unsuccessful.  Hospital Course:  Principal Problem:   Altered mental status Active Problems:   Elevated lactic acid level  Altered mental status His poor historian, unable to provide much of medical history.Unclear etiology for altered  mental status. Seen by neurology, TSH wnl,  EEG unremarkable, CT head/MRI brain unremarkable. Urine tox is positive for THC, however patient denies use.   Patient started on high-dose Rocephin /vanc empirically for meningitis, LP done once in the ED and twice in radiology but was unsuccessful.  Patient mental status completely recovered at the time of discharge. No nuchal rigidity, no light sensitivity, no headache. He insist on being discharged home, will discharge with oral cefdinir for 10days.   Leukocytosis: wbc 12.8 on presentation, increased after steroid given in the ED.  Cxr/ua unremarkable. No fever. Blood culture pending. mrsa screen negative. From dehydration?  Elevated lactic acid Resolved,  from metformin +/- dehydration? Metformin discontinued at discharge.  Psych: continue all home meds. pmd to monitor QTc. QTc here 477.  Mild elevation of ammonia with normal LFT: ammonia 46. unclear etiology. Outpatient monitoring by pmd.   Procedures:  EEG/MRI brain/CT head  Consultations:  neurology  Discharge Exam: BP 145/80 mmHg  Pulse 79  Temp(Src) 98.3 F (36.8 C) (Oral)  Resp 18  Ht  (1.905 m)  Wt 108.863 kg (240 lb)  BMI 30.00 kg/m2  SpO2 100%  General: NAD, AAOx3, ambulating without difficulty Cardiovascular: RRR Respiratory: CTABL  Discharge Instructions You were cared for by a hospitalist during your hospital stay. If you have any questions about your discharge medications or the care you received while you were in the hospital after you are discharged, you can call the unit and asked to speak with the hospitalist on call if the hospitalist that took care of you is  not available. Once you are discharged, your primary care physician will handle any further medical issues. Please note that NO REFILLS for any discharge medications will be authorized once you are discharged, as it is imperative that you return to your primary care physician (or establish a  relationship with a primary care physician if you do not have one) for your aftercare needs so that they can reassess your need for medications and monitor your lab values.      Discharge Instructions    Diet - low sodium heart healthy    Complete by:  As directed   Low salt, low fat, carb modified.     Increase activity slowly    Complete by:  As directed             Medication List    STOP taking these medications        metFORMIN 500 MG tablet  Commonly known as:  GLUCOPHAGE      TAKE these medications        cefdinir 300 MG capsule  Commonly known as:  OMNICEF  Take 1 capsule (300 mg total) by mouth 2 (two) times daily.     clonazePAM 0.5 MG tablet  Commonly known as:  KLONOPIN  Take 0.5 mg by mouth 2 (two) times daily.     cloNIDine 0.1 MG tablet  Commonly known as:  CATAPRES  Take 0.05-0.2 mg by mouth See admin instructions. Takes 1/2 tablet (= 0.05mg ) in the morning and at [facility] and takes 2 tablets (= 0.2 mg) at bedtime.     divalproex 500 MG 24 hr tablet  Commonly known as:  DEPAKOTE ER  Take 1,500 mg by mouth at bedtime.     gabapentin 600 MG tablet  Commonly known as:  NEURONTIN  Take 600 mg by mouth 2 (two) times daily.     sertraline 100 MG tablet  Commonly known as:  ZOLOFT  Take 100 mg by mouth daily.     topiramate 100 MG tablet  Commonly known as:  TOPAMAX  Take 100 mg by mouth 2 (two) times daily.     traZODone 100 MG tablet  Commonly known as:  DESYREL  Take 100 mg by mouth at bedtime.       Allergies  Allergen Reactions  . Other     Chinese food--used benadryl   Follow-up Information    Follow up with AVBUERE,EDWIN A, MD In 1 week.   Specialty:  Internal Medicine   Why:  hospital discharge follow up   Contact information:   [address] [place] [address] [phone]        The results of significant diagnostics from this hospitalization (including imaging, microbiology, ancillary and laboratory) are listed below  for reference.    Significant Diagnostic Studies: Ct Head Wo Contrast  09/03/2014   CLINICAL DATA:  Lethargy, history mental retardation, bipolar disorder, ADHD, responsive to verbal and painful stimuli, smoker  EXAM: CT HEAD WITHOUT CONTRAST  TECHNIQUE: Contiguous axial images were obtained from the base of the skull through the vertex without intravenous contrast.  COMPARISON:  None  FINDINGS: Normal ventricular morphology.  No midline shift or mass effect.  Normal appearance of brain parenchyma.  No intracranial hemorrhage, mass lesion, or acute infarction.  Visualized paranasal sinuses and mastoid air cells clear.  Bones unremarkable.  IMPRESSION: Normal exam.   Electronically Signed   By: Ulyses Southward M.D.   On: 09/03/2014 11:21   Mr Brain Wo Contrast  09/04/2014   CLINICAL DATA:  Group home resident brought in because of altered mental status. Urine drug screen positive for THC.  EXAM: MRI HEAD WITHOUT CONTRAST  TECHNIQUE: Multiplanar, multiecho pulse sequences of the brain and surrounding structures were obtained without intravenous contrast.  COMPARISON:  CT head 09/03/2014.  Lumbar puncture was unsuccessful.  FINDINGS: No evidence for acute infarction, hemorrhage, mass lesion, hydrocephalus, or extra-axial fluid. Normal cerebral volume. No white matter disease. Flow voids are maintained throughout the carotid, basilar, and vertebral arteries. There are no areas of chronic hemorrhage.  Pituitary, pineal, and cerebellar tonsils unremarkable. No upper cervical lesions. Visualized calvarium, skull base, and upper cervical osseous structures unremarkable. Scalp, orbits, sinuses, and mastoids show no acute process. Shotty suboccipital adenopathy nonspecific.  Gadolinium was not administered.  IMPRESSION: Negative noncontrast MR brain exam.   Electronically Signed   By: Elsie Stain M.D.   On: 09/04/2014 15:11   Dg Chest Port 1 View  09/03/2014   CLINICAL DATA:  Altered mental status.  EXAM: PORTABLE  CHEST - 1 VIEW  COMPARISON:  None.  FINDINGS: The cardiomediastinal silhouette is within normal limits. The lungs are mildly hypoinflated. No confluent airspace opacity, edema, pleural effusion, or pneumothorax is identified. No acute osseous abnormality is seen.  IMPRESSION: Mild hypoinflation.  No evidence of acute airspace disease.   Electronically Signed   By: Sebastian Ache   On: 09/03/2014 11:23   Dg Fluoro Guide Ndl Plcd/bx/inj/loc  09/03/2014   CLINICAL DATA:  Mental status changes.  EXAM: DIAGNOSTIC LUMBAR PUNCTURE UNDER FLUOROSCOPIC GUIDANCE  FLUOROSCOPY TIME:  Radiation Exposure Index (as provided by the fluoroscopic device): 1 minutes and 1 second  If the device does not provide the exposure index:  Fluoroscopy Time (in minutes and seconds):  Number of Acquired Images:  1  PROCEDURE: Informed consent was obtained from the patient prior to the procedure, including potential complications of headache, allergy, and pain. With the patient prone, the lower back was prepped with Betadine. 1% Lidocaine was used for local anesthesia. Lumbar puncture was attempted at the L2-3 and L3-4 level using a 20 gauge needle. No CSF could be obtained for evaluation despite 3 attempts.  IMPRESSION: Unsuccessful fluoroscopic guided lumbar puncture.   Electronically Signed   By: Rudie Meyer M.D.   On: 09/03/2014 18:45    Microbiology: Recent Results (from the past 240 hour(s))  Urine culture     Status: None   Collection Time: 09/03/14 11:40 AM  Result Value Ref Range Status   Specimen Description URINE, CATHETERIZED  Final   Special Requests NONE  Final   Culture NO GROWTH 1 DAY  Final   Report Status 09/04/2014 FINAL  Final  Culture, blood (routine x 2)     Status: None (Preliminary result)   Collection Time: 09/03/14 12:50 PM  Result Value Ref Range Status   Specimen Description BLOOD RIGHT ANTECUBITAL  Final   Special Requests BOTTLES DRAWN AEROBIC AND ANAEROBIC 5CC  Final   Culture NO GROWTH 1 DAY   Final   Report Status PENDING  Incomplete  Culture, blood (routine x 2)     Status: None (Preliminary result)   Collection Time: 09/03/14  1:00 PM  Result Value Ref Range Status   Specimen Description BLOOD RIGHT HAND  Final   Special Requests BOTTLES DRAWN AEROBIC AND ANAEROBIC 5CC  Final   Culture NO GROWTH 1 DAY  Final   Report Status PENDING  Incomplete  MRSA PCR Screening  Status: None   Collection Time: 09/03/14  6:56 PM  Result Value Ref Range Status   MRSA by PCR NEGATIVE NEGATIVE Final    Comment:        The GeneXpert MRSA Assay (FDA approved for NASAL specimens only), is one component of a comprehensive MRSA colonization surveillance program. It is not intended to diagnose MRSA infection nor to guide or monitor treatment for MRSA infections.      Labs: Basic Metabolic Panel:  Recent Labs Lab 09/03/14 1043 09/04/14 0541  NA 138 144  K 3.5 4.7  CL 107 108  CO2 20* 22  GLUCOSE 179* 127*  BUN 11 15  CREATININE 0.97 1.13  CALCIUM 9.5 10.1   Liver Function Tests:  Recent Labs Lab 09/03/14 1043  AST 26  ALT 29  ALKPHOS 49  BILITOT 0.5  PROT 7.0  ALBUMIN 3.9   No results for input(s): LIPASE, AMYLASE in the last 168 hours.  Recent Labs Lab 09/03/14 1640 09/03/14 2016  AMMONIA 49* 46*   CBC:  Recent Labs Lab 09/03/14 1043 09/04/14 0541  WBC 12.8* 17.0*  NEUTROABS 7.6  --   HGB 17.2* 17.2*  HCT 48.9 50.1  MCV 88.7 88.2  PLT 233 220   Cardiac Enzymes: No results for input(s): CKTOTAL, CKMB, CKMBINDEX, TROPONINI in the last 168 hours. BNP: BNP (last 3 results) No results for input(s): BNP in the last 8760 hours.  ProBNP (last 3 results) No results for input(s): PROBNP in the last 8760 hours.  CBG: No results for input(s): GLUCAP in the last 168 hours.     SignedAlbertine Grates MD, PhD  [facility] 09/04/2014, 10:05 PM

## 2014-09-04 NOTE — Progress Notes (Signed)
MD. Notified of patients refusal to wear telemetry and removal of IV. Patient agreeable to having MRI procedure.Marland KitchenMarland KitchenWill not let this RN restart IV fluids.

## 2014-09-05 LAB — HEMOGLOBIN A1C
Hgb A1c MFr Bld: 7.2 % — ABNORMAL HIGH (ref 4.8–5.6)
Hgb A1c MFr Bld: 7 % — ABNORMAL HIGH (ref 4.8–5.6)
Mean Plasma Glucose: 160 mg/dL
Mean Plasma Glucose: 154 mg/dL

## 2014-09-08 LAB — CULTURE, BLOOD (ROUTINE X 2)
Culture: NO GROWTH
Culture: NO GROWTH

## 2021-06-10 ENCOUNTER — Encounter (HOSPITAL_COMMUNITY): Payer: Self-pay | Admitting: Emergency Medicine

## 2021-06-10 ENCOUNTER — Emergency Department (HOSPITAL_COMMUNITY): Payer: Medicare Other

## 2021-06-10 ENCOUNTER — Emergency Department (HOSPITAL_COMMUNITY)
Admission: EM | Admit: 2021-06-10 | Discharge: 2021-06-10 | Disposition: A | Discharge status: Home / Self Care | Payer: Medicare Other | Attending: Emergency Medicine | Admitting: Emergency Medicine

## 2021-06-10 DIAGNOSIS — R0789 Other chest pain: Secondary | ICD-10-CM | POA: Insufficient documentation

## 2021-06-10 DIAGNOSIS — R079 Chest pain, unspecified: Secondary | ICD-10-CM | POA: Diagnosis present

## 2021-06-10 LAB — CBC
HCT: 50.8 % (ref 39.0–52.0)
Hemoglobin: 17.4 g/dL — ABNORMAL HIGH (ref 13.0–17.0)
MCH: 30.6 pg (ref 26.0–34.0)
MCHC: 34.3 g/dL (ref 30.0–36.0)
MCV: 89.3 fL (ref 80.0–100.0)
Platelets: 282 10*3/uL (ref 150–400)
RBC: 5.69 MIL/uL (ref 4.22–5.81)
RDW: 12.7 % (ref 11.5–15.5)
WBC: 17.6 10*3/uL — ABNORMAL HIGH (ref 4.0–10.5)
nRBC: 0 % (ref 0.0–0.2)

## 2021-06-10 LAB — BASIC METABOLIC PANEL
Anion gap: 9 (ref 5–15)
BUN: 15 mg/dL (ref 6–20)
CO2: 26 mmol/L (ref 22–32)
Calcium: 9.6 mg/dL (ref 8.9–10.3)
Chloride: 102 mmol/L (ref 98–111)
Creatinine, Ser: 0.93 mg/dL (ref 0.61–1.24)
GFR, Estimated: >60 mL/min (ref >60)
Glucose, Bld: 178 mg/dL — ABNORMAL HIGH (ref 70–99)
Potassium: 4.4 mmol/L (ref 3.5–5.1)
Sodium: 137 mmol/L (ref 135–145)

## 2021-06-10 LAB — D-DIMER, QUANTITATIVE: D-Dimer, Quant: <0.27 ug/mL-FEU (ref 0.00–0.50)

## 2021-06-10 LAB — TROPONIN I (HIGH SENSITIVITY)
Troponin I (High Sensitivity): 5 ng/L (ref <18)
Troponin I (High Sensitivity): 4 ng/L (ref <18)

## 2021-06-10 MED ORDER — KETOROLAC TROMETHAMINE 30 MG/ML IJ SOLN
30.0000 mg | Freq: Once | INTRAMUSCULAR | Status: AC
  Administered 2021-06-10: 30 mg via INTRAVENOUS
  Filled 2021-06-10: qty 1

## 2021-06-10 MED ORDER — NAPROXEN 500 MG PO TABS: 500.0000 mg | ORAL_TABLET | Freq: Two times a day (BID) | ORAL | 0 refills | Status: DC

## 2021-06-10 NOTE — ED Triage Notes (Signed)
Patient complains of left sided chest pain that started four days ago. Pain is exacerbated by coughing, breathing, and lifting his arms. Denies shortness of breath, nausea, and dizziness. Patient is alert, oriented, ambulatory, and in no apparent distress at this time. ?

## 2021-06-10 NOTE — ED Provider Triage Note (Signed)
Emergency Medicine Provider Triage Evaluation Note ? ?Mark [name] , a 31 y.o. male  was evaluated in triage.  Pt complains of chest pain for the last 4 days.  Patient states that the chest pain is located on the left side of his chest, does not radiate, worsened with exertion and taking deep breaths.  Chest pain is reproducible on palpation. ? ?Review of Systems  ?Positive: Chest pain, chest tenderness ?Negative: Shortness of breath, leg swelling, nausea, vomiting, abdominal pain ? ?Physical Exam  ?BP 134/86 (BP Location: Right Arm)   Pulse (!) 105   Temp 98.6 ?F (37 ?C) (Oral)   Resp 16   SpO2 97%  ?Gen:   Awake, no distress   ?Resp:  Normal effort  ?MSK:   Moves extremities without difficulty  ?Other:  Lungs clear ? ?Medical Decision Making  ?Medically screening exam initiated at 5:39 PM.  Appropriate orders placed.  Mark [name] was informed that the remainder of the evaluation will be completed by another provider, this initial triage assessment does not replace that evaluation, and the importance of remaining in the ED until their evaluation is complete. ? ? ?  ?Al Decant, PA-C ?06/10/21 1740 ? ?

## 2021-06-10 NOTE — Discharge Instructions (Addendum)
Take medication as needed for pain. ? ?Return for new or worsening symptoms otherwise follow-up with primary care provider ?

## 2021-06-10 NOTE — ED Provider Notes (Signed)
[facility] EMERGENCY DEPARTMENT Provider Note   CSN: [id number] Arrival date & time: 06/10/21  1719     History  Chief Complaint  Patient presents with   Chest Pain    Mark [name] is a 31 y.o. male with no significant past medical history here for evaluation of chest pain.  Pain located left side of chest.  Began 4 days ago.  Pain worse with coughing, deep breathing, lifting of heavy objects, palpating left chest, laying on his left side.  Pain does not radiate to left arm, left back or jaw.  Pain is not exertional in nature.  No history of PE, DVT, AAA or dissection.  No recent surgery, immobilization or malignancy.  No swelling, pain to bilateral lower extremities.  He denies associated diaphoresis, nausea, vomiting, shortness of breath, hemoptysis, dizziness, abdominal pain, back pain, numbness or weakness.  No meds PTA.  No recent sick contacts. No illicit substance use. No recent traumatic injuries.  HPI     Home Medications Prior to Admission medications   Medication Sig Start Date End Date Taking? Authorizing Provider  naproxen (NAPROSYN) 500 MG tablet Take 1 tablet (500 mg total) by mouth 2 (two) times daily. 06/10/21  Yes Noelle Hoogland A, PA-C  cefdinir (OMNICEF) 300 MG capsule Take 1 capsule (300 mg total) by mouth 2 (two) times daily. 09/04/14   Albertine Grates, MD  clonazePAM (KLONOPIN) 0.5 MG tablet Take 0.5 mg by mouth 2 (two) times daily.    [provider]  cloNIDine (CATAPRES) 0.1 MG tablet Take 0.05-0.2 mg by mouth See admin instructions. Takes 1/2 tablet (= 0.05mg ) in the morning and at [facility] and takes 2 tablets (= 0.2 mg) at bedtime.    [provider]  divalproex (DEPAKOTE ER) 500 MG 24 hr tablet Take 1,500 mg by mouth at bedtime.    [provider]  gabapentin (NEURONTIN) 600 MG tablet Take 600 mg by mouth 2 (two) times daily.    [provider]  sertraline (ZOLOFT) 100 MG tablet Take 100 mg by mouth daily.     [provider]  topiramate (TOPAMAX) 100 MG tablet Take 100 mg by mouth 2 (two) times daily.    [provider]  traZODone (DESYREL) 100 MG tablet Take 100 mg by mouth at bedtime.    [provider]      Allergies    Other    Review of Systems   Review of Systems  Constitutional: Negative.   HENT: Negative.    Respiratory:  Negative for apnea, cough, choking, chest tightness, shortness of breath, wheezing and stridor.   Cardiovascular:  Positive for chest pain. Negative for palpitations and leg swelling.  Gastrointestinal: Negative.   Genitourinary: Negative.   Musculoskeletal: Negative.   Skin: Negative.   Neurological: Negative.   All other systems reviewed and are negative.  Physical Exam Updated Vital Signs BP (!) 122/57   Pulse 74   Temp 98.6 F (37 C) (Oral)   Resp (!) 26   SpO2 96%  Physical Exam Vitals and nursing note reviewed.  Constitutional:      General: He is not in acute distress.    Appearance: He is well-developed. He is not ill-appearing, toxic-appearing or diaphoretic.  HENT:     Head: Atraumatic.  Eyes:     Pupils: Pupils are equal, round, and reactive to light.  Cardiovascular:     Rate and Rhythm: Normal rate and regular rhythm.     Pulses: Normal  pulses.          Radial pulses are 2+ on the right side and 2+ on the left side.       Dorsalis pedis pulses are 2+ on the right side and 2+ on the left side.       Posterior tibial pulses are 2+ on the right side and 2+ on the left side.     Heart sounds: Normal heart sounds.  Pulmonary:     Effort: Pulmonary effort is normal. No respiratory distress.     Breath sounds: Normal breath sounds and air entry.     Comments: Clear bilaterally, speaks in full sentences without difficulty Chest:     Chest wall: Tenderness present. No mass, lacerations, deformity, swelling, crepitus or edema. There is no dullness to percussion.       Comments: Reproducible tenderness to left  chest wall without crepitus, erythema or ecchymosis Abdominal:     General: Bowel sounds are normal. There is no distension or abdominal bruit.     Palpations: Abdomen is soft. There is no hepatomegaly or mass.     Tenderness: There is no abdominal tenderness. There is no guarding or rebound.     Comments: Soft, tender  Musculoskeletal:        General: Normal range of motion.     Cervical back: Normal range of motion and neck supple.     Right lower leg: No tenderness. No edema.     Left lower leg: No tenderness. No edema.     Comments: Full range of motion, no bony tenderness, compartments soft, no posterior calf tenderness  Skin:    General: Skin is warm and dry.     Capillary Refill: Capillary refill takes less than 2 seconds.     Comments: No edema, erythema or warmth.  No fluctuance or induration.  Neurological:     General: No focal deficit present.     Mental Status: He is alert and oriented to person, place, and time.     Cranial Nerves: Cranial nerves 2-12 are intact.     Sensory: Sensation is intact.     Gait: Gait is intact.    ED Results / Procedures / Treatments   Labs (all labs ordered are listed, but only abnormal results are displayed) Labs Reviewed  BASIC METABOLIC PANEL - Abnormal; Notable for the following components:      Result Value   Glucose, Bld 178 (*)    All other components within normal limits  CBC - Abnormal; Notable for the following components:   WBC 17.6 (*)    Hemoglobin 17.4 (*)    All other components within normal limits  D-DIMER, QUANTITATIVE  TROPONIN I (HIGH SENSITIVITY)  TROPONIN I (HIGH SENSITIVITY)    EKG EKG Interpretation  Date/Time:  Friday June 10 2021 17:35:10 EDT Ventricular Rate:  105 PR Interval:  132 QRS Duration: 88 QT Interval:  336 QTC Calculation: 444 R Axis:   75 Text Interpretation: Sinus tachycardia Right atrial enlargement Possible Inferior infarct , age undetermined Abnormal ECG When compared with ECG of  03-Sep-2014 10:35, PREVIOUS ECG IS PRESENT when compared to prior, similar appearance. subtle S1Q3T3. no STEMI Confirmed by Theda Belfast ([id number]) on 06/10/2021 7:59:13 PM  Radiology DG Chest 2 View  Result Date: 06/10/2021 CLINICAL DATA:  Chest pain EXAM: CHEST - 2 VIEW COMPARISON:  09/03/2014 FINDINGS: The heart size and mediastinal contours are within normal limits. Both lungs are clear. The visualized skeletal structures are unremarkable. IMPRESSION: No  active cardiopulmonary disease. Electronically Signed   By: Ernie Avena M.D.   On: 06/10/2021 18:41    Procedures Procedures    Medications Ordered in ED Medications  ketorolac (TORADOL) 30 MG/ML injection 30 mg (30 mg Intravenous Given 06/10/21 2012)    ED Course/ Medical Decision Making/ A&P    31 year old here for evaluation of left-sided chest pain over the last 4 days.  No recent traumatic injuries.  Pain worse with movement, laying on left side, deep breathing, cough and when he palpates the left side of his chest.  He has no clinical evidence of VTE on exam.  Initially was tachycardic at 105 on arrival however resolved.  No prior history of PE or DVT.  Pain is reproducible to palpation of his left anterior chest.  No crepitus, step-off to suggest traumatic injury.  No overlying skin changes to suggest infectious process.  He denies any coughing to suggest URI.  Labs and imaging personally viewed and interpreted:  Leukocytosis at 17.6 similar to prior Metabolic panel mild hyperglycemia 178 Trop 5--4 Ddimer <0.27 Chest x-ray without any acute infiltrates, pneumothorax, pulm edema, pneumothorax EKG without ischemic changes, possible slight S1 Q3  Patient reassessed.  He currently has no pain.  Discussed reassuring work-up.  Suspect MSK etiology given I am able to reproduce his pain.  At this time I have low suspicion for acute ACS, PE, dissection, bacterial infectious process, Myo/pericarditis.  Discussed symptomatic  treatment at home, return for new or worsening symptom.  Patient agreeable.  The patient has been appropriately medically screened and/or stabilized in the ED. I have low suspicion for any other emergent medical condition which would require further screening, evaluation or treatment in the ED or require inpatient management.  Patient is hemodynamically stable and in no acute distress.  Patient able to ambulate in department prior to ED.  Evaluation does not show acute pathology that would require ongoing or additional emergent interventions while in the emergency department or further inpatient treatment.  I have discussed the diagnosis with the patient and answered all questions.  Pain is been managed while in the emergency department and patient has no further complaints prior to discharge.  Patient is comfortable with plan discussed in room and is stable for discharge at this time.  I have discussed strict return precautions for returning to the emergency department.  Patient was encouraged to follow-up with PCP/specialist refer to at discharge.                            Medical Decision Making Amount and/or Complexity of Data Reviewed External Data Reviewed: labs, radiology, ECG and notes. Labs: ordered. Decision-making details documented in ED Course. Radiology: ordered and independent interpretation performed. Decision-making details documented in ED Course. ECG/medicine tests: ordered and independent interpretation performed. Decision-making details documented in ED Course.  Risk OTC drugs. Prescription drug management. Diagnosis or treatment significantly limited by social determinants of health.          Final Clinical Impression(s) / ED Diagnoses Final diagnoses:  Chest wall pain    Rx / DC Orders ED Discharge Orders          Ordered    naproxen (NAPROSYN) 500 MG tablet  2 times daily        06/10/21 2213              Meekah Math A, PA-C 06/10/21  2214    Tegeler, Canary Brim, MD 06/10/21  2355  

## 2023-04-05 ENCOUNTER — Encounter (HOSPITAL_COMMUNITY): Payer: Self-pay | Admitting: Emergency Medicine

## 2023-04-08 ENCOUNTER — Emergency Department (HOSPITAL_COMMUNITY): Payer: Medicare HMO

## 2023-04-08 ENCOUNTER — Emergency Department (HOSPITAL_COMMUNITY)
Admission: EM | Admit: 2023-04-08 | Discharge: 2023-04-08 | Disposition: A | Discharge status: Home / Self Care | Payer: Medicare HMO | Attending: Emergency Medicine | Admitting: Emergency Medicine

## 2023-04-08 ENCOUNTER — Other Ambulatory Visit: Payer: Self-pay

## 2023-04-08 ENCOUNTER — Encounter (HOSPITAL_COMMUNITY): Payer: Self-pay

## 2023-04-08 DIAGNOSIS — L03115 Cellulitis of right lower limb: Secondary | ICD-10-CM | POA: Insufficient documentation

## 2023-04-08 DIAGNOSIS — Z7984 Long term (current) use of oral hypoglycemic drugs: Secondary | ICD-10-CM | POA: Insufficient documentation

## 2023-04-08 DIAGNOSIS — M79671 Pain in right foot: Secondary | ICD-10-CM | POA: Diagnosis present

## 2023-04-08 DIAGNOSIS — E119 Type 2 diabetes mellitus without complications: Secondary | ICD-10-CM | POA: Diagnosis not present

## 2023-04-08 LAB — CBC WITH DIFFERENTIAL/PLATELET
Abs Immature Granulocytes: 0.24 10*3/uL — ABNORMAL HIGH (ref 0.00–0.07)
Basophils Absolute: 0.1 10*3/uL (ref 0.0–0.1)
Basophils Relative: 1 %
Eosinophils Absolute: 0.3 10*3/uL (ref 0.0–0.5)
Eosinophils Relative: 1 %
HCT: 47.5 % (ref 39.0–52.0)
Hemoglobin: 16.2 g/dL (ref 13.0–17.0)
Immature Granulocytes: 1 %
Lymphocytes Relative: 13 %
Lymphs Abs: 2.6 10*3/uL (ref 0.7–4.0)
MCH: 30.4 pg (ref 26.0–34.0)
MCHC: 34.1 g/dL (ref 30.0–36.0)
MCV: 89.1 fL (ref 80.0–100.0)
Monocytes Absolute: 2.6 10*3/uL — ABNORMAL HIGH (ref 0.1–1.0)
Monocytes Relative: 13 %
Neutro Abs: 14 10*3/uL — ABNORMAL HIGH (ref 1.7–7.7)
Neutrophils Relative %: 71 %
Platelets: 330 10*3/uL (ref 150–400)
RBC: 5.33 MIL/uL (ref 4.22–5.81)
RDW: 12.2 % (ref 11.5–15.5)
WBC: 19.8 10*3/uL — ABNORMAL HIGH (ref 4.0–10.5)
nRBC: 0 % (ref 0.0–0.2)

## 2023-04-08 LAB — BASIC METABOLIC PANEL
Anion gap: 8 (ref 5–15)
BUN: 13 mg/dL (ref 6–20)
CO2: 27 mmol/L (ref 22–32)
Calcium: 9.5 mg/dL (ref 8.9–10.3)
Chloride: 102 mmol/L (ref 98–111)
Creatinine, Ser: 0.91 mg/dL (ref 0.61–1.24)
GFR, Estimated: >60 mL/min (ref >60)
Glucose, Bld: 165 mg/dL — ABNORMAL HIGH (ref 70–99)
Potassium: 4.1 mmol/L (ref 3.5–5.1)
Sodium: 137 mmol/L (ref 135–145)

## 2023-04-08 MED ORDER — CEPHALEXIN 500 MG PO CAPS: 500.0000 mg | ORAL_CAPSULE | Freq: Four times a day (QID) | ORAL | 0 refills | Status: DC

## 2023-04-08 MED ORDER — OXYCODONE-ACETAMINOPHEN 5-325 MG PO TABS
1.0000 | ORAL_TABLET | Freq: Once | ORAL | Status: AC
Start: 2023-04-08 — End: 2023-04-08
  Administered 2023-04-08: 1 via ORAL
  Filled 2023-04-08: qty 1

## 2023-04-08 MED ORDER — OXYCODONE HCL 5 MG PO TABS: 5.0000 mg | ORAL_TABLET | Freq: Four times a day (QID) | ORAL | 0 refills | Status: DC | PRN

## 2023-04-08 MED ORDER — CEPHALEXIN 250 MG PO CAPS
500.0000 mg | ORAL_CAPSULE | Freq: Once | ORAL | Status: AC
  Administered 2023-04-08: 500 mg via ORAL
  Filled 2023-04-08: qty 2

## 2023-04-08 MED ORDER — IBUPROFEN 400 MG PO TABS
600.0000 mg | ORAL_TABLET | Freq: Once | ORAL | Status: AC
  Administered 2023-04-08: 600 mg via ORAL
  Filled 2023-04-08: qty 1

## 2023-04-08 NOTE — ED Provider Notes (Signed)
[place] EMERGENCY DEPARTMENT AT [facility] Provider Note   CSN: [id number] Arrival date & time: 04/08/23  1121     History  Chief Complaint  Patient presents with   Foot Pain    Mark [name] is a 33 y.o. male.  Patient is a 33 year old male with a past medical history of diabetes, bipolar disorder, prior Lisfranc fracture 3 years ago with last operation 1 year ago presenting to the emergency department with right foot pain and swelling.  Patient states that he started to develop pain in the medial aspect of his right foot last night with some swelling.  He states that he took ibuprofen and elevated his foot however when he woke up this morning the swelling had worsened.  He states that he noticed a small amount of redness around his surgical scar.  He denies any fever or drainage.  He denies any trauma or falls.  The history is provided by the patient.  Foot Pain       Home Medications Prior to Admission medications   Medication Sig Start Date End Date Taking? Authorizing Provider  cephALEXin (KEFLEX) 500 MG capsule Take 1 capsule (500 mg total) by mouth 4 (four) times daily. 04/08/23  Yes Theresia Lo, Benetta Spar K, DO  oxyCODONE (ROXICODONE) 5 MG immediate release tablet Take 1 tablet (5 mg total) by mouth every 6 (six) hours as needed. 04/08/23  Yes Theresia Lo, Benetta Spar K, DO  cefdinir (OMNICEF) 300 MG capsule Take 1 capsule (300 mg total) by mouth 2 (two) times daily. 09/04/14   Albertine Grates, MD  clonazePAM (KLONOPIN) 0.5 MG tablet Take 0.5 mg by mouth 2 (two) times daily.    [provider]  cloNIDine (CATAPRES) 0.1 MG tablet Take 0.05-0.2 mg by mouth See admin instructions. Takes 1/2 tablet (= 0.05mg ) in the morning and at [facility] and takes 2 tablets (= 0.2 mg) at bedtime.    [provider]  divalproex (DEPAKOTE ER) 500 MG 24 hr tablet Take 1,500 mg by mouth at bedtime.    [provider]  gabapentin (NEURONTIN) 600 MG tablet Take 600 mg by mouth  2 (two) times daily.    [provider]  naproxen (NAPROSYN) 500 MG tablet Take 1 tablet (500 mg total) by mouth 2 (two) times daily. 06/10/21   Henderly, Britni A, PA-C  sertraline (ZOLOFT) 100 MG tablet Take 100 mg by mouth daily.    [provider]  topiramate (TOPAMAX) 100 MG tablet Take 100 mg by mouth 2 (two) times daily.    [provider]  traZODone (DESYREL) 100 MG tablet Take 100 mg by mouth at bedtime.    [provider]      Allergies    Other    Review of Systems   Review of Systems  Physical Exam Updated Vital Signs BP 127/79   Pulse 90   Temp 98.7 F (37.1 C) (Oral)   Resp 16   Ht 6\' 3"  (1.905 m)   Wt 108.9 kg   SpO2 100%   BMI 30.00 kg/m  Physical Exam Vitals and nursing note reviewed.  Constitutional:      General: He is not in acute distress.    Appearance: Normal appearance.  HENT:     Head: Normocephalic.     Nose: Nose normal.     Mouth/Throat:     Mouth: Mucous membranes are moist.  Eyes:     Extraocular Movements: Extraocular movements intact.  Cardiovascular:     Rate  and Rhythm: Normal rate.     Pulses: Normal pulses.  Pulmonary:     Effort: Pulmonary effort is normal.  Musculoskeletal:        General: Normal range of motion.     Cervical back: Normal range of motion.     Comments: Surgical scars of R foot appear well healed. Swelling on medial aspect of R foot with some erythema and warmth near scars, no drainage, no palpable fluctuance, tenderness to palpation  Skin:    General: Skin is warm and dry.  Neurological:     General: No focal deficit present.     Mental Status: He is alert and oriented to person, place, and time.  Psychiatric:        Mood and Affect: Mood normal.        Behavior: Behavior normal.        ED Results / Procedures / Treatments   Labs (all labs ordered are listed, but only abnormal results are displayed) Labs Reviewed  CBC WITH DIFFERENTIAL/PLATELET - Abnormal; Notable  for the following components:      Result Value   WBC 19.8 (*)    Neutro Abs 14.0 (*)    Monocytes Absolute 2.6 (*)    Abs Immature Granulocytes 0.24 (*)    All other components within normal limits  BASIC METABOLIC PANEL - Abnormal; Notable for the following components:   Glucose, Bld 165 (*)    All other components within normal limits  CULTURE, BLOOD (ROUTINE X 2)  CULTURE, BLOOD (ROUTINE X 2)    EKG None  Radiology DG Foot Complete Right Result Date: 04/08/2023 CLINICAL DATA:  Pain and swelling. EXAM: RIGHT FOOT COMPLETE - 3+ VIEW; RIGHT ANKLE - 2 VIEW COMPARISON:  03/17/2022. FINDINGS: No acute fracture or dislocation. No aggressive osseous lesion. Redemonstration of moderate hallux valgus deformity. Redemonstration of healed second through fourth Lisfranc fracture dislocations. Redemonstration of asymmetric moderate degenerative changes and postsurgical changes of midfoot joints. Residual metallic hardware fragment noted in the medial cuneiform. The ankle mortise is intact. There are mild degenerative changes of the ankle joint. There is asymmetric focal soft tissue swelling along the medial aspect of the midfoot. However, no underlying bone erosions. No soft tissue defect or air within the soft tissue. Correlate with physical examination. No radiopaque foreign bodies. IMPRESSION: *No acute osseous abnormality of the right ankle or foot. *Asymmetric focal soft tissue swelling along the medial aspect of the midfoot however, no underlying bony erosions, soft tissue defect or air within the soft tissue. Correlate with physical examination. *Multiple other nonacute observations, as described above. Electronically Signed   By: Jules Schick M.D.   On: 04/08/2023 12:48   DG Ankle 2 Views Right Result Date: 04/08/2023 CLINICAL DATA:  Pain and swelling. EXAM: RIGHT FOOT COMPLETE - 3+ VIEW; RIGHT ANKLE - 2 VIEW COMPARISON:  03/17/2022. FINDINGS: No acute fracture or dislocation. No aggressive  osseous lesion. Redemonstration of moderate hallux valgus deformity. Redemonstration of healed second through fourth Lisfranc fracture dislocations. Redemonstration of asymmetric moderate degenerative changes and postsurgical changes of midfoot joints. Residual metallic hardware fragment noted in the medial cuneiform. The ankle mortise is intact. There are mild degenerative changes of the ankle joint. There is asymmetric focal soft tissue swelling along the medial aspect of the midfoot. However, no underlying bone erosions. No soft tissue defect or air within the soft tissue. Correlate with physical examination. No radiopaque foreign bodies. IMPRESSION: *No acute osseous abnormality of the right ankle or foot. *Asymmetric  focal soft tissue swelling along the medial aspect of the midfoot however, no underlying bony erosions, soft tissue defect or air within the soft tissue. Correlate with physical examination. *Multiple other nonacute observations, as described above. Electronically Signed   By: Jules Schick M.D.   On: 04/08/2023 12:48    Procedures .Ultrasound ED Soft Tissue  Date/Time: 04/08/2023 4:19 PM  Performed by: Rexford Maus, DO Authorized by: Rexford Maus, DO   Procedure details:    Indications: limb pain     Transverse view:  Visualized   Longitudinal view:  Visualized   Images: archived   Location:    Location: lower extremity     Side:  Right Findings:     no abscess present     Medications Ordered in ED Medications  cephALEXin (KEFLEX) capsule 500 mg (has no administration in time range)  ibuprofen (ADVIL) tablet 600 mg (600 mg Oral Given 04/08/23 1557)  oxyCODONE-acetaminophen (PERCOCET/ROXICET) 5-325 MG per tablet 1 tablet (1 tablet Oral Given 04/08/23 1557)    ED Course/ Medical Decision Making/ A&P                                 Medical Decision Making This patient presents to the ED with chief complaint(s) of foot pain/swelling with pertinent past  medical history of DM with Charcot's foot, prior Lisfranc fracture of R foot, bipolar disorder which further complicates the presenting complaint. The complaint involves an extensive differential diagnosis and also carries with it a high risk of complications and morbidity.    The differential diagnosis includes cellulitis, osteomyelitis, fracture, dislocation, abscess, no significant pain or progression with no palpable crepitus making necrotizing fasciitis unlikely   Additional history obtained: Additional history obtained from N/A Records reviewed Care Everywhere/External Records and Primary Care Documents  ED Course and Reassessment: On patient's arrival he was initially tachycardic otherwise hemodynamically stable in no acute distress.  Was initially evaluated in triage and had labs and x-ray of his right foot and ankle performed.  Labs showed a leukocytosis and otherwise were within normal range.  X-ray showed no acute bony injury just soft tissue swelling.  Bedside ultrasound was performed that showed no evidence of abscess.  He does have some mild erythema and warmth associated with the swelling concerning for early cellulitis and will be started on antibiotics as well as given pain control.  Patient also requested cane to help with ambulation.  It is recommended close primary care follow-up in orthopedics if needed.  Independent labs interpretation:  The following labs were independently interpreted: Leukocytosis otherwise within normal range  Independent visualization of imaging: - I independently visualized the following imaging with scope of interpretation limited to determining acute life threatening conditions related to emergency care: Right foot/ankle x-ray, which revealed no acute disease  Consultation: - Consulted or discussed management/test interpretation w/ external professional: N/A  Consideration for admission or further workup: Patient has no emergent conditions requiring  admission or further work-up at this time and is stable for discharge home with primary care follow-up  Social Determinants of health: N/A    Risk Prescription drug management.          Final Clinical Impression(s) / ED Diagnoses Final diagnoses:  Foot pain, right  Cellulitis of right lower extremity    Rx / DC Orders ED Discharge Orders          Ordered    cephALEXin (KEFLEX) 500  MG capsule  4 times daily        04/08/23 1629    oxyCODONE (ROXICODONE) 5 MG immediate release tablet  Every 6 hours PRN        04/08/23 1629              Rexford Maus, DO 04/08/23 1630

## 2023-04-08 NOTE — Discharge Instructions (Signed)
You were seen in the emergency department for your foot pain and swelling.  It appears that you are developing an early skin infection and I have given you a course of antibiotics he should complete this as prescribed.  You should ice your foot and keep it elevated to help with the swelling.  You can take Tylenol and Motrin every 6 hours as needed for pain and I have given you oxycodone for breakthrough pain.  This can make you drowsy so do not take it while driving, working or operating heavy machinery.  You should follow-up with your primary doctor in the next few days to have your symptoms rechecked and should follow-up with your orthopedic doctor if you are having continued pain or swelling.  You should return to the emergency department for any streaking redness up your leg despite the antibiotics, fevers despite the antibiotics, increased swelling to your ankle and calf or any other new or concerning symptoms.

## 2023-04-08 NOTE — ED Provider Triage Note (Signed)
Emergency Medicine Provider Triage Evaluation Note  Mark [name] , a 33 y.o. male  was evaluated in triage.  Pt complains of R foot pain starting last night, previously had surgery on foot in 2023, screws taken out in 03/16/2022. Pt has a previous Hx of mental retardation (has a legal guardian), ADHD, Bipolar 1.   Has previously been able to walk as of yesterday, but is no longer able to bear weight due to pain. Denies trauma.   Endorses tender to palpation, foot swelling, decreased ROM at ankle and foot.  Denies fevers, chest pain, shortness of breath, abdominal pain, n/v/d, erythema, numbness/tingling.  Review of Systems  Positive: See above Negative: See above  Physical Exam  BP 130/71 (BP Location: Right Arm)   Pulse (!) 109   Temp 98.5 F (36.9 C)   Resp 16   Ht 6\' 3"  (1.905 m)   Wt 108.9 kg   SpO2 96%   BMI 30.00 kg/m  Gen:   Awake, no distress   Resp:  Normal effort  MSK:   Moves extremities without difficulty  Other:    Medical Decision Making  Medically screening exam initiated at 11:44 AM.  Appropriate orders placed.  Melany Guernsey was informed that the remainder of the evaluation will be completed by another provider, this initial triage assessment does not replace that evaluation, and the importance of remaining in the ED until their evaluation is complete.     Lunette Stands, [place] 04/08/23 1149

## 2023-04-08 NOTE — ED Triage Notes (Signed)
Patient had surg on left foot last year and reports last night it started throbbing and swelling. Significant swelling noted. Patient has legal guardian which he is trying to get in touch with.

## 2023-04-13 LAB — CULTURE, BLOOD (ROUTINE X 2): Culture: NO GROWTH
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
## 2023-05-07 ENCOUNTER — Ambulatory Visit (HOSPITAL_BASED_OUTPATIENT_CLINIC_OR_DEPARTMENT_OTHER): Payer: Medicare HMO | Admitting: Student

## 2023-05-07 VITALS — BP 147/89 | HR 72 | Ht 74.0 in | Wt 211.0 lb

## 2023-05-07 DIAGNOSIS — F411 Generalized anxiety disorder: Secondary | ICD-10-CM | POA: Insufficient documentation

## 2023-05-07 DIAGNOSIS — F639 Impulse disorder, unspecified: Secondary | ICD-10-CM | POA: Insufficient documentation

## 2023-05-07 DIAGNOSIS — F4310 Post-traumatic stress disorder, unspecified: Secondary | ICD-10-CM | POA: Insufficient documentation

## 2023-05-07 DIAGNOSIS — F12980 Cannabis use, unspecified with anxiety disorder: Secondary | ICD-10-CM | POA: Insufficient documentation

## 2023-05-07 DIAGNOSIS — F79 Unspecified intellectual disabilities: Secondary | ICD-10-CM | POA: Insufficient documentation

## 2023-05-07 MED ORDER — FLUOXETINE HCL 10 MG PO CAPS: ORAL_CAPSULE | ORAL | 0 refills | Status: DC

## 2023-05-07 MED ORDER — DIVALPROEX SODIUM ER 500 MG PO TB24: 2000.0000 mg | ORAL_TABLET | Freq: Every day | ORAL | 1 refills | Status: DC

## 2023-05-07 NOTE — Assessment & Plan Note (Signed)
 2-3 times per week of Sativa or Indica -Advise cessation

## 2023-05-07 NOTE — Assessment & Plan Note (Signed)
Recommend psychotherapy

## 2023-05-08 ENCOUNTER — Encounter (HOSPITAL_COMMUNITY): Payer: Self-pay | Admitting: Student

## 2023-05-08 LAB — HEPATIC FUNCTION PANEL
ALT: 23 [IU]/L (ref 0–44)
AST: 18 [IU]/L (ref 0–40)
Albumin: 4.2 g/dL (ref 4.1–5.1)
Alkaline Phosphatase: 69 [IU]/L (ref 44–121)
Bilirubin Total: 0.2 mg/dL (ref 0.0–1.2)
Bilirubin, Direct: 0.09 mg/dL (ref 0.00–0.40)
Total Protein: 6.6 g/dL (ref 6.0–8.5)

## 2023-05-08 LAB — VALPROIC ACID LEVEL: Valproic Acid Lvl: <4 ug/mL — ABNORMAL LOW (ref 50–100)

## 2023-05-08 NOTE — Addendum Note (Signed)
 Addended by: Everlena Cooper on: 05/08/2023 09:26 AM   Modules accepted: Level of Service

## 2023-05-17 ENCOUNTER — Ambulatory Visit (HOSPITAL_COMMUNITY): Payer: Medicare HMO | Admitting: Licensed Clinical Social Worker

## 2023-06-05 ENCOUNTER — Ambulatory Visit (HOSPITAL_COMMUNITY): Payer: Medicare HMO | Admitting: Student

## 2023-06-05 ENCOUNTER — Ambulatory Visit (HOSPITAL_COMMUNITY): Admitting: Licensed Clinical Social Worker

## 2023-06-05 ENCOUNTER — Telehealth (HOSPITAL_COMMUNITY): Payer: Self-pay | Admitting: *Deleted

## 2023-06-05 NOTE — Telephone Encounter (Signed)
 Pt called LVM on nurse line confirming appointment tomorrow with Dr. Hazle Quant.

## 2023-06-06 ENCOUNTER — Encounter (HOSPITAL_COMMUNITY): Payer: Self-pay | Admitting: Student

## 2023-06-06 ENCOUNTER — Ambulatory Visit (HOSPITAL_BASED_OUTPATIENT_CLINIC_OR_DEPARTMENT_OTHER): Admitting: Student

## 2023-06-06 VITALS — BP 137/77 | HR 82 | Wt 204.0 lb

## 2023-06-06 DIAGNOSIS — F639 Impulse disorder, unspecified: Secondary | ICD-10-CM

## 2023-06-06 DIAGNOSIS — F79 Unspecified intellectual disabilities: Secondary | ICD-10-CM

## 2023-06-06 DIAGNOSIS — F411 Generalized anxiety disorder: Secondary | ICD-10-CM

## 2023-06-06 DIAGNOSIS — F12980 Cannabis use, unspecified with anxiety disorder: Secondary | ICD-10-CM

## 2023-06-06 DIAGNOSIS — F4310 Post-traumatic stress disorder, unspecified: Secondary | ICD-10-CM

## 2023-06-06 MED ORDER — DIVALPROEX SODIUM ER 500 MG PO TB24: 2000.0000 mg | ORAL_TABLET | Freq: Every day | ORAL | 0 refills | Status: DC

## 2023-06-06 MED ORDER — FLUOXETINE HCL 20 MG PO CAPS: 20.0000 mg | ORAL_CAPSULE | Freq: Every day | ORAL | 0 refills | Status: DC

## 2023-06-06 NOTE — Addendum Note (Signed)
 Addended by: Everlena Cooper on: 06/06/2023 03:58 PM   Modules accepted: Level of Service
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
## 2023-06-07 LAB — VALPROIC ACID LEVEL: Valproic Acid Lvl: 18 ug/mL — ABNORMAL LOW (ref 50–100)

## 2023-06-12 ENCOUNTER — Ambulatory Visit (INDEPENDENT_AMBULATORY_CARE_PROVIDER_SITE_OTHER): Payer: Self-pay | Admitting: Licensed Clinical Social Worker

## 2023-06-12 ENCOUNTER — Encounter (HOSPITAL_COMMUNITY): Payer: Self-pay | Admitting: Licensed Clinical Social Worker

## 2023-06-12 DIAGNOSIS — F639 Impulse disorder, unspecified: Secondary | ICD-10-CM

## 2023-06-12 NOTE — Progress Notes (Signed)
 Patient no showed for initial assessment

## 2023-07-04 ENCOUNTER — Telehealth (HOSPITAL_COMMUNITY): Payer: Self-pay

## 2023-07-04 DIAGNOSIS — F411 Generalized anxiety disorder: Secondary | ICD-10-CM

## 2023-07-04 DIAGNOSIS — F4310 Post-traumatic stress disorder, unspecified: Secondary | ICD-10-CM

## 2023-07-04 MED ORDER — FLUOXETINE HCL 20 MG PO CAPS: 20.0000 mg | ORAL_CAPSULE | Freq: Every day | ORAL | 0 refills | Status: DC

## 2023-07-04 NOTE — Telephone Encounter (Signed)
 reordered

## 2023-07-04 NOTE — Telephone Encounter (Signed)
 I received a fax from the patients pharmacy [facility]. Patient has lost his Prozac  and is out of it - they are requesting a replacement prescription. Please review and advise, thank you
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
## 2023-08-13 ENCOUNTER — Ambulatory Visit (HOSPITAL_BASED_OUTPATIENT_CLINIC_OR_DEPARTMENT_OTHER): Admitting: Student

## 2023-08-13 DIAGNOSIS — F639 Impulse disorder, unspecified: Secondary | ICD-10-CM | POA: Diagnosis not present

## 2023-08-13 DIAGNOSIS — F4310 Post-traumatic stress disorder, unspecified: Secondary | ICD-10-CM | POA: Diagnosis not present

## 2023-08-13 DIAGNOSIS — F411 Generalized anxiety disorder: Secondary | ICD-10-CM | POA: Diagnosis not present

## 2023-08-13 MED ORDER — DIVALPROEX SODIUM ER 500 MG PO TB24: 2000.0000 mg | ORAL_TABLET | Freq: Every day | ORAL | 0 refills | Status: DC

## 2023-08-13 MED ORDER — FLUOXETINE HCL 20 MG PO CAPS: 20.0000 mg | ORAL_CAPSULE | Freq: Every day | ORAL | 0 refills | Status: DC

## 2023-08-14 ENCOUNTER — Encounter (HOSPITAL_COMMUNITY): Payer: Self-pay | Admitting: Student

## 2023-08-16 NOTE — Addendum Note (Signed)
 Addended by: Ulysess Gang A on: 08/16/2023 12:27 PM   Modules accepted: Level of Service

## 2023-09-05 ENCOUNTER — Ambulatory Visit (HOSPITAL_COMMUNITY): Admitting: Licensed Clinical Social Worker

## 2023-10-02 ENCOUNTER — Telehealth (HOSPITAL_COMMUNITY): Payer: Self-pay | Admitting: Licensed Clinical Social Worker

## 2023-10-02 ENCOUNTER — Encounter (HOSPITAL_COMMUNITY): Payer: Self-pay | Admitting: Licensed Clinical Social Worker

## 2023-10-02 ENCOUNTER — Ambulatory Visit (INDEPENDENT_AMBULATORY_CARE_PROVIDER_SITE_OTHER): Payer: Self-pay | Admitting: Licensed Clinical Social Worker

## 2023-10-02 DIAGNOSIS — Z0389 Encounter for observation for other suspected diseases and conditions ruled out: Secondary | ICD-10-CM

## 2023-10-02 NOTE — Progress Notes (Signed)
 Patient no showed.  No more appointments to be made with this provider.

## 2023-10-02 NOTE — Telephone Encounter (Signed)
 Per provider, patient has been discharged from practice due to multipe missed appointments. Dismissal letter has been sent via certified mail on 10/02/2023.

## 2023-10-12 NOTE — Progress Notes (Deleted)
 BH MD Outpatient Progress Note  10/12/2023 10:57 AM Herb Beltre  MRN:  [id number]  Assessment:  [name] presents for follow-up evaluation. Today, 10/12/23, patient reports ***  The risks/benefits/side-effects/alternatives to this medication were discussed in detail with the patient and time was given for questions. The patient consents to medication trial.   Identifying Information: Wylee Dorantes is a 33 y.o. male with a history of *** who is an established patient with [facility] for management of ***.  Risk Assessment: An assessment of suicide and violence risk factors was performed as part of this evaluation and is not *** significantly changed from the last visit.             While future psychiatric events cannot be accurately predicted, the patient does not *** currently require acute inpatient psychiatric care and does not *** currently meet [place]  involuntary commitment criteria.          Plan:  # PTSD Past medication trials:  Status of problem: *** Interventions: -- continue prozac  20mg  daily  # Intellectual disability # Intermittent Explosive Disorder Past medication trials:  Status of problem: *** Interventions: --continue depakote  ER 2000 mg at bedtime  --04/2023 VPA level <4; 05/2023 VPA level 18  --f/u depakote  level --hepatic function panel 05/07/23 wnl  # GAD  Past medication trials:  Status of problem: *** Interventions: -- prozac  as above   # Cannabis use with anxiety disorder  Past medication trials:  Status of problem: *** Interventions: -- advise cessation  Return to care in ***  Patient was given contact information for behavioral health clinic and was instructed to call 911 for emergencies.   Patient and plan of care will be discussed with the Attending MD ,Dr. ***, who agrees with the above statement and plan.   Subjective:  Chief Complaint: No chief complaint on file.   Interval  History: *** Labs: CMP, levels, UDS, PDMP  04/2023 Hepatic fxn wnl. 03/2023 BMP wnl, 03/2023 CBC with leukocytosis. Plts wnl  08/2014 UDS +THC, PDMP gabapentin  300mg  90# 30 days, filled 08/11/2023 EKG: 06/2021 Qtc 444 with sinus tachycardia MRI brain / EEG: 08/2014 MRI brain and EEG wnl. Sleep study: none Last visit, med changes   -08/2023 with Dr. Lynnette - continue prozac  20mg  daily, continue depakote  ER 2000mg  at bedtime, trazodone  200 at bedtime  Interval notes:  -discharged from [facility] therapy  Pre-charting: problem list, meds, prior encounters, no shows   ? repeat depakote  level  ? Consider decrease depakote  - 1500  ? History of medication non-adherence - did he increase depakote  to 2000mg  at bedtime?  -has legal guardian  [ ]  sleep [ ]  appetite  [ ]  medication side effects  [ ]  mood  [ ]  stressors  [ ]  substance use - still using THC 2-3x per week  [ ]  safety    Visit Diagnosis: No diagnosis found.  Past Psychiatric History:  Diagnoses: PTSD, intellectual disability, intermittent explosive disorder, GAD, cannabis use with anxiety Medication trials:   Current: prozac  20mg  daily, depakote  ER 2000mg  at bedtime, trazodone  200 at bedtime   Past: gabapentin , clonidine , clonazepam , sertraline , quetiapine  Previous psychiatrist/therapist: Dr. Lynnette Hospitalizations: 4 psych hosp in middle school for anger and depression  Suicide attempts: SI in middle school of putting knife to neck, immolating self on trampoline, and hanging himself  SIB: denies  Hx of violence towards others: *** Current access to guns: *** Hx of trauma/abuse: prior concussions as child but  unable to recall when  Developmental history: ***  Substance Use History: UDS, PDMP EtOH:  reports no history of alcohol use. Nicotine:  reports that he has been smoking. He does not have any smokeless tobacco history on file. cigarettes THC/CBD: sativia and indica for anxiety 2-3 per week IV drug use:  denies Stimulants: denies Opiates: denies Sedative/hypnotics: denies Hallucinogens: denies Seizures: denies  Initial note:  Ellias Mcelreath is a 33 y.o. male with PMH of IDD, IED, ADD, bipolar disorder, hypertension, type 2 diabetes mellitus c/b Charcot's foot, dyslipidemia , 3 suicide attempt during adolesence, 4 inpt adolescent psych admission, who presented in person for psychiatric evaluation of anxiety along with legal guardian [name]. Patient lives in a group home and has a caregiver. His psychotropics include depakote  DR 1000 mg bid and trazodone  200 mg at bedtime. It appears he had been previously on Depakote  ER likely for impulse control related to IDD vs ADD. Per patient's history, it seems his symptoms are more consistent with Generalized Anxiety Disorder, PTSD, and IED rather than bipolar disorder. His mood swings do not meet criteria for mania or hypomania. Depakote  is still a good option to aid with impulse control and mood lability but patient does not appear to meet criteria for bipolar disorder. We will start fluoxetine  to address his anxiety and PTSD. Plan to keep remainder of medications the same but will get a depakote  level this visit to evaluate for trough level and hepatic function panel. Patient is also seeking to start psychotherapy so she will see [name] on 05/17/23. Patient to follow up with me in 1 month to evaluate for progress.   Past Medical History:  Past Medical History:  Diagnosis Date   ADHD (attention deficit hyperactivity disorder)    Bipolar 1 disorder (HCC)    Mental retardation    No past surgical history on file. Dx:  has a past medical history of ADHD (attention deficit hyperactivity disorder), Bipolar 1 disorder (HCC), and Mental retardation.  Seizures: denies Allergies: Other   Family Psychiatric History: GAD and MDD   Family History:  Family History  Problem Relation Age of Onset   Hypertension Mother    Hyperlipidemia  Mother     Social History:  Living with: 1 roommate, landlady Income: maintenance for [employer] Children: none Support: Child psychotherapist, Arboriculturist, nieces, sister Guns/Weapons: denies Armed forces operational officer: denies Developmental: IDD  Substance Use History:   Social History   Socioeconomic History   Marital status: Single    Spouse name: Not on file   Number of children: Not on file   Years of education: Not on file   Highest education level: Not on file  Occupational History   Not on file  Tobacco Use   Smoking status: Every Day   Smokeless tobacco: Not on file  Substance and Sexual Activity   Alcohol use: No   Drug use: No   Sexual activity: Not on file  Other Topics Concern   Not on file  Social History Narrative   ** Merged History Encounter **       Social Drivers of Health   Financial Resource Strain: Low Risk  (02/20/2023)   Received from [facility]   Overall Financial Resource Strain (CARDIA)    Difficulty of Paying Living Expenses: Not hard at all  Food Insecurity: No Food Insecurity (02/20/2023)   Received from [facility]   Hunger Vital Sign    Within the past 12 months, you worried that your food would run  out before you got the money to buy more.: Never true    Within the past 12 months, the food you bought just didn't last and you didn't have money to get more.: Never true  Transportation Needs: No Transportation Needs (02/20/2023)   Received from [facility]   PRAPARE - Transportation    Lack of Transportation (Medical): No    Lack of Transportation (Non-Medical): No  Physical Activity: Sufficiently Active (02/20/2023)   Received from [facility]   Exercise Vital Sign    On average, how many days per week do you engage in moderate to strenuous exercise (like a brisk walk)?: 5 days    On average, how many minutes do you engage in exercise at this level?: 120 min  Stress: Stress Concern Present (02/20/2023)   Received from [facility] - Occupational Stress Questionnaire    Feeling of Stress : To some extent  Social Connections: Somewhat Isolated (02/20/2023)   Received from [facility]   Social Network    How would you rate your social network (family, work, friends)?: Restricted participation with some degree of social isolation    Allergies:  Allergies  Allergen Reactions   Other     Chinese food--used benadryl     Current Medications: Current Outpatient Medications  Medication Sig Dispense Refill   cefdinir  (OMNICEF ) 300 MG capsule Take 1 capsule (300 mg total) by mouth 2 (two) times daily. 20 capsule 0   cephALEXin  (KEFLEX ) 500 MG capsule Take 1 capsule (500 mg total) by mouth 4 (four) times daily. 20 capsule 0   divalproex  (DEPAKOTE  ER) 500 MG 24 hr tablet Take 4 tablets (2,000 mg total) by mouth at bedtime. 360 tablet 0   FLUoxetine  (PROZAC ) 20 MG capsule Take 1 capsule (20 mg total) by mouth daily. 90 capsule 0   naproxen  (NAPROSYN ) 500 MG tablet Take 1 tablet (500 mg total) by mouth 2 (two) times daily. 30 tablet 0   topiramate  (TOPAMAX ) 100 MG tablet Take 100 mg by mouth 2 (two) times daily.     traZODone  (DESYREL ) 100 MG tablet Take 2 tablets by mouth at bedtime.     No current facility-administered medications for this visit.    ROS: Review of Systems  Objective:  Psychiatric Specialty Exam: There were no vitals taken for this visit.There is no height or weight on file to calculate BMI.  General Appearance: {Appearance:22683}  Eye Contact:  {BHH EYE CONTACT:22684}  Speech:  {Speech:22685}  Volume:  {Volume (PAA):22686}  Mood:  {BHH MOOD:22306}  Affect:  {Affect (PAA):22687}  Thought Content: {Thought Content:22690}   Suicidal Thoughts:  {ST/HT (PAA):22692}  Homicidal Thoughts:  {ST/HT (PAA):22692}  Thought Process:  {Thought Process (PAA):22688}  Orientation:  {BHH ORIENTATION (PAA):22689}    Memory: Grossly intact ***  Judgment:  {Judgement (PAA):22694}   Insight:  {Insight (PAA):22695}  Concentration:  {Concentration:21399}  Recall: not formally assessed ***  Fund of Knowledge: {BHH GOOD/FAIR/POOR:22877}  Language: {BHH GOOD/FAIR/POOR:22877}  Psychomotor Activity:  {Psychomotor (PAA):22696}  Akathisia:  {BHH YES OR NO:22294}  AIMS (if indicated): {Desc; done/not:10129}  Assets:  {Assets (PAA):22698}  ADL's:  {BHH JIO'D:77709}  Cognition: {chl bhh cognition:304700322}  Sleep:  {BHH GOOD/FAIR/POOR:22877}   PE: General: well-appearing; no acute distress *** Pulm: no increased work of breathing on room air *** Strength & Muscle Tone: {desc; muscle tone:32375} Neuro: no focal neurological deficits observed *** Gait & Station: {PE GAIT ED WJUO:77474}  Metabolic Disorder Labs: Lab Results  Component  Value Date   HGBA1C 7.0 (H) 09/04/2014   MPG 154 09/04/2014   MPG 160 09/03/2014   No results found for: PROLACTIN No results found for: CHOL, TRIG, HDL, CHOLHDL, VLDL, LDLCALC Lab Results  Component Value Date   TSH 0.713 09/03/2014   TSH 0.954 09/03/2014    Therapeutic Level Labs: No results found for: LITHIUM Lab Results  Component Value Date   VALPROATE 18 (L) 06/06/2023   VALPROATE <4 (L) 05/07/2023   No results found for: CBMZ  Screenings:  Flowsheet Row ED from [facility]/08/2023 in [facility] Emergency Department at [facility] ED from 06/10/2021 in [facility] Emergency Department at [facility]  C-SSRS RISK CATEGORY No Risk No Risk    Collaboration of Care: Collaboration of Care: [facility] OP Collaboration of Rjmz:[id number]}  Patient/Guardian was advised Release of Information must be obtained prior to any record release in order to collaborate their care with an outside provider. Patient/Guardian was advised if they have not already done so to contact the registration department to sign all necessary forms in order for us  to release information regarding their care.   Consent: Patient/Guardian  gives verbal consent for treatment and assignment of benefits for services provided during this visit. Patient/Guardian expressed understanding and agreed to proceed.   Corean Minor, MD, PGY-3 10/12/2023, 10:57 AM

## 2023-10-15 ENCOUNTER — Ambulatory Visit (HOSPITAL_COMMUNITY): Admitting: Psychiatry

## 2023-10-15 ENCOUNTER — Telehealth (HOSPITAL_COMMUNITY): Payer: Self-pay | Admitting: Psychiatry

## 2023-10-15 DIAGNOSIS — F639 Impulse disorder, unspecified: Secondary | ICD-10-CM

## 2023-10-15 NOTE — Telephone Encounter (Signed)
 At 9:52, called patient at preferred home phone number [phone] regarding scheduled appointment today at 9:45am. No answer and no ability to leave voicemail.

## 2023-10-27 ENCOUNTER — Emergency Department (HOSPITAL_COMMUNITY)

## 2023-10-27 ENCOUNTER — Emergency Department (HOSPITAL_COMMUNITY)
Admission: EM | Admit: 2023-10-27 | Discharge: 2023-10-27 | Disposition: A | Discharge status: Home / Self Care | Attending: Emergency Medicine | Admitting: Emergency Medicine

## 2023-10-27 ENCOUNTER — Other Ambulatory Visit: Payer: Self-pay

## 2023-10-27 ENCOUNTER — Encounter (HOSPITAL_COMMUNITY): Payer: Self-pay | Admitting: Emergency Medicine

## 2023-10-27 DIAGNOSIS — M79671 Pain in right foot: Secondary | ICD-10-CM | POA: Diagnosis present

## 2023-10-27 MED ORDER — DOXYCYCLINE HYCLATE 100 MG PO TABS
100.0000 mg | ORAL_TABLET | Freq: Once | ORAL | Status: AC
  Administered 2023-10-27: 100 mg via ORAL
  Filled 2023-10-27: qty 1

## 2023-10-27 MED ORDER — PREDNISONE 10 MG PO TABS: 20.0000 mg | ORAL_TABLET | Freq: Every day | ORAL | 0 refills | Status: AC

## 2023-10-27 MED ORDER — DOXYCYCLINE HYCLATE 100 MG PO CAPS: 100.0000 mg | ORAL_CAPSULE | Freq: Two times a day (BID) | ORAL | 0 refills | Status: DC

## 2023-10-27 MED ORDER — PREDNISONE 20 MG PO TABS
20.0000 mg | ORAL_TABLET | Freq: Once | ORAL | Status: AC
  Administered 2023-10-27: 20 mg via ORAL
  Filled 2023-10-27: qty 1

## 2023-10-27 NOTE — Discharge Instructions (Signed)
 Follow-up with your primary care doctor for recheck.  Continue to monitor your blood sugar.  I am putting you on antibiotics and steroids.  Steroids might increase your blood sugar.  My suspicion is that you likely have an inflammatory process.

## 2023-10-27 NOTE — ED Provider Notes (Signed)
 [place] EMERGENCY DEPARTMENT AT [facility] Provider Note   CSN: [id number] Arrival date & time: 10/27/23  1927     Patient presents with: Foot Pain   Mark [name] is a 33 y.o. male.   Patient here with some pain and swelling to the right foot and ankle for the last week.  He had surgery a few years ago on the foot.  Will swell on him every once in a while.  Possibly gout history.  History of bipolar.  He is not a diabetic.  Denies any trauma.  Denies any fevers or chills.  Nothing makes it worse or better.  The history is provided by the patient.       Prior to Admission medications   Medication Sig Start Date End Date Taking? Authorizing Provider  doxycycline  (VIBRAMYCIN ) 100 MG capsule Take 1 capsule (100 mg total) by mouth 2 (two) times daily. 10/27/23  Yes Mark Grillo, DO  predniSONE  (DELTASONE ) 10 MG tablet Take 2 tablets (20 mg total) by mouth daily for 3 days. 10/27/23 10/30/23 Yes Mark Luppino, DO  cefdinir  (OMNICEF ) 300 MG capsule Take 1 capsule (300 mg total) by mouth 2 (two) times daily. 09/04/14   Mark Keys, MD  cephALEXin  (KEFLEX ) 500 MG capsule Take 1 capsule (500 mg total) by mouth 4 (four) times daily. 04/08/23   [name], Mark K, DO  divalproex  (DEPAKOTE  ER) 500 MG 24 hr tablet Take 4 tablets (2,000 mg total) by mouth at bedtime. 08/13/23 11/11/23  Mark Barter, MD  FLUoxetine  (PROZAC ) 20 MG capsule Take 1 capsule (20 mg total) by mouth daily. 08/13/23 11/11/23  Mark Barter, MD  naproxen  (NAPROSYN ) 500 MG tablet Take 1 tablet (500 mg total) by mouth 2 (two) times daily. 06/10/21   [name], Mark A, PA-C  topiramate  (TOPAMAX ) 100 MG tablet Take 100 mg by mouth 2 (two) times daily.    [provider]  traZODone  (DESYREL ) 100 MG tablet Take 2 tablets by mouth at bedtime. 07/14/21   [provider]    Allergies: Other    Review of Systems  Updated Vital Signs BP (!) 148/81 (BP Location: Left Arm)   Pulse 87   Temp 98 F (36.7 C)  (Oral)   Resp 16   Ht 6' 2 (1.88 m)   Wt 88.5 kg   SpO2 99%   BMI 25.04 kg/m   Physical Exam Vitals and nursing note reviewed.  Constitutional:      General: He is not in acute distress.    Appearance: He is well-developed.  HENT:     Head: Normocephalic and atraumatic.     Mouth/Throat:     Mouth: Mucous membranes are moist.  Eyes:     Extraocular Movements: Extraocular movements intact.     Conjunctiva/sclera: Conjunctivae normal.     Pupils: Pupils are equal, round, and reactive to light.  Cardiovascular:     Rate and Rhythm: Normal rate and regular rhythm.     Pulses: Normal pulses.     Heart sounds: Normal heart sounds. No murmur heard. Pulmonary:     Effort: Pulmonary effort is normal. No respiratory distress.     Breath sounds: Normal breath sounds.  Abdominal:     Palpations: Abdomen is soft.     Tenderness: There is no abdominal tenderness.  Musculoskeletal:        General: Tenderness present. No swelling. Normal range of motion.     Cervical back: Neck supple.     Comments: Got  a little bit of swelling to the top of the right foot and the lateral ankle but there is no major warmth erythema is got normal range of motion at the ankle no purulent drainage  Skin:    General: Skin is warm and dry.     Capillary Refill: Capillary refill takes less than 2 seconds.  Neurological:     General: No focal deficit present.     Mental Status: He is alert.  Psychiatric:        Mood and Affect: Mood normal.     (all labs ordered are listed, but only abnormal results are displayed) Labs Reviewed - No data to display  EKG: None  Radiology: DG Ankle Complete Right Result Date: 10/27/2023 CLINICAL DATA:  Ankle pain EXAM: RIGHT ANKLE - COMPLETE 3+ VIEW COMPARISON:  04/08/2023, CT 09/01/2021 FINDINGS: Lateral soft tissue swelling. Chronic osteochondral lesion at the medial talar dome. Mild tibiotalar degenerative changes. No acute osseous abnormality IMPRESSION: 1. Lateral  soft tissue swelling. No acute osseous abnormality. 2. Small chronic osteochondral lesion at the medial talar dome. Electronically Signed   By: Mark [name] M.D.   On: 10/27/2023 20:31   DG Foot Complete Right Result Date: 10/27/2023 CLINICAL DATA:  Foot and ankle pain history of surgery EXAM: RIGHT FOOT COMPLETE - 3+ VIEW COMPARISON:  04/08/2023, 09/01/2021 FINDINGS: No acute fracture is seen. Moderate hallux valgus deformity at the first MTP joint with moderate bunion head of first metatarsal. Old healed fracture deformities of the second through fourth metatarsals. Moderate degenerative changes at the midfoot. Metallic fragment in the first cuneiform bone as before. Soft tissues are unremarkable. Skin dimpling versus small ulcer lateral aspect of the foot at the level of proximal fifth metatarsal. IMPRESSION: 1. No acute osseous abnormality. 2. Moderate hallux valgus deformity at the first MTP joint with moderate bunion head of first metatarsal. 3. Post-traumatic and postsurgical changes related to history of prior Lisfranc fracture dislocation 4. Cutaneous dimple or small ulcer lateral soft tissues at the level of proximal fifth metatarsal, correlate with direct inspection Electronically Signed   By: Mark [name] M.D.   On: 10/27/2023 20:28     Procedures   Medications Ordered in the ED  predniSONE  (DELTASONE ) tablet 20 mg (has no administration in time range)  doxycycline  (VIBRA -TABS) tablet 100 mg (has no administration in time range)                                    Medical Decision Making Amount and/or Complexity of Data Reviewed Radiology: ordered.  Risk Prescription drug management.   Mark [name] is here with right foot and right ankle pain and swelling.  Differential diagnosis likely inflammatory process seems less likely to be a cellulitic process.  I have no concern for septic joint.  Seems less likely to be gout.  Normal vitals.  No fever.  No history of diabetes.   He is neurovascular neuromuscularly intact on exam.  Have no concern for blood clot or arterial process.  He is got good pulses.  X-rays of the foot and ankle show no obvious abnormality.  Does have a bad bunion to the big toe.  He has posttraumatic and postsurgical changes with his history of prior Lisfranc fracture dislocation.  Discussed small ulcer on this foot as well.  Overall I have no concern for septic joint.  This could be an inflammatory process or an infectious  process.  Will conservatively put him on a small dose of steroids for a few days and put him in a walking boot.  I will put him on doxycycline  and follow-up with primary care for recheck.  He understands return if symptoms worsen.  Discharge.  This chart was dictated using voice recognition software.  Despite best efforts to proofread,  errors can occur which can change the documentation meaning.      Final diagnoses:  Foot pain, right    ED Discharge Orders          Ordered    predniSONE  (DELTASONE ) 10 MG tablet  Daily        10/27/23 2142    doxycycline  (VIBRAMYCIN ) 100 MG capsule  2 times daily        10/27/23 2142               Ruthe Cornet, DO 10/27/23 2144

## 2023-10-27 NOTE — ED Triage Notes (Signed)
 Pt presents to the ED via POV with complaints of R foot/ankle pain x 1 week. Pt notes having surgery on the foot in 2023 and has been walking more over the last several weeks causing discomfort. Rates the pain 7/10. No acute injury no fall. A&Ox4 at this time.

## 2023-11-02 ENCOUNTER — Other Ambulatory Visit: Payer: Self-pay

## 2023-11-02 ENCOUNTER — Encounter (HOSPITAL_COMMUNITY): Payer: Self-pay

## 2023-11-02 ENCOUNTER — Emergency Department (HOSPITAL_COMMUNITY)

## 2023-11-02 ENCOUNTER — Observation Stay (HOSPITAL_COMMUNITY)
Admission: EM | Admit: 2023-11-02 | Discharge: 2023-11-02 | Disposition: A | Discharge status: Home / Self Care | Attending: Family Medicine | Admitting: Family Medicine

## 2023-11-02 ENCOUNTER — Emergency Department (HOSPITAL_COMMUNITY)
Admission: EM | Admit: 2023-11-02 | Discharge: 2023-11-02 | Disposition: A | Discharge status: Home / Self Care | Attending: Emergency Medicine | Admitting: Emergency Medicine

## 2023-11-02 ENCOUNTER — Encounter (HOSPITAL_COMMUNITY): Payer: Self-pay | Admitting: Emergency Medicine

## 2023-11-02 DIAGNOSIS — F17200 Nicotine dependence, unspecified, uncomplicated: Secondary | ICD-10-CM | POA: Diagnosis not present

## 2023-11-02 DIAGNOSIS — T310 Burns involving less than 10% of body surface: Secondary | ICD-10-CM | POA: Diagnosis not present

## 2023-11-02 DIAGNOSIS — R251 Tremor, unspecified: Secondary | ICD-10-CM | POA: Diagnosis not present

## 2023-11-02 DIAGNOSIS — R55 Syncope and collapse: Principal | ICD-10-CM | POA: Insufficient documentation

## 2023-11-02 DIAGNOSIS — D72829 Elevated white blood cell count, unspecified: Secondary | ICD-10-CM | POA: Insufficient documentation

## 2023-11-02 DIAGNOSIS — R4182 Altered mental status, unspecified: Secondary | ICD-10-CM | POA: Insufficient documentation

## 2023-11-02 DIAGNOSIS — T24101A Burn of first degree of unspecified site of right lower limb, except ankle and foot, initial encounter: Secondary | ICD-10-CM | POA: Insufficient documentation

## 2023-11-02 DIAGNOSIS — X16XXXA Contact with hot heating appliances, radiators and pipes, initial encounter: Secondary | ICD-10-CM | POA: Insufficient documentation

## 2023-11-02 DIAGNOSIS — W19XXXA Unspecified fall, initial encounter: Secondary | ICD-10-CM

## 2023-11-02 DIAGNOSIS — T24001A Burn of unspecified degree of unspecified site of right lower limb, except ankle and foot, initial encounter: Secondary | ICD-10-CM

## 2023-11-02 LAB — COMPREHENSIVE METABOLIC PANEL WITH GFR
ALT: 22 U/L (ref 0–44)
ALT: 21 U/L (ref 0–44)
AST: 26 U/L (ref 15–41)
AST: 21 U/L (ref 15–41)
Albumin: 3.1 g/dL — ABNORMAL LOW (ref 3.5–5.0)
Albumin: 3.2 g/dL — ABNORMAL LOW (ref 3.5–5.0)
Alkaline Phosphatase: 52 U/L (ref 38–126)
Alkaline Phosphatase: 46 U/L (ref 38–126)
Anion gap: 10 (ref 5–15)
Anion gap: 9 (ref 5–15)
BUN: 11 mg/dL (ref 6–20)
BUN: 12 mg/dL (ref 6–20)
CO2: 26 mmol/L (ref 22–32)
CO2: 24 mmol/L (ref 22–32)
Calcium: 9.1 mg/dL (ref 8.9–10.3)
Calcium: 9.1 mg/dL (ref 8.9–10.3)
Chloride: 104 mmol/L (ref 98–111)
Chloride: 111 mmol/L (ref 98–111)
Creatinine, Ser: 0.91 mg/dL (ref 0.61–1.24)
Creatinine, Ser: 0.84 mg/dL (ref 0.61–1.24)
GFR, Estimated: >60 mL/min (ref >60)
GFR, Estimated: >60 mL/min (ref >60)
Glucose, Bld: 160 mg/dL — ABNORMAL HIGH (ref 70–99)
Glucose, Bld: 79 mg/dL (ref 70–99)
Potassium: 4 mmol/L (ref 3.5–5.1)
Potassium: 4.5 mmol/L (ref 3.5–5.1)
Sodium: 140 mmol/L (ref 135–145)
Sodium: 144 mmol/L (ref 135–145)
Total Bilirubin: 0.5 mg/dL (ref 0.0–1.2)
Total Bilirubin: 0.6 mg/dL (ref 0.0–1.2)
Total Protein: 6 g/dL — ABNORMAL LOW (ref 6.5–8.1)
Total Protein: 6 g/dL — ABNORMAL LOW (ref 6.5–8.1)

## 2023-11-02 LAB — CBC WITH DIFFERENTIAL/PLATELET
Abs Immature Granulocytes: 0.08 K/uL — ABNORMAL HIGH (ref 0.00–0.07)
Abs Immature Granulocytes: 0.11 K/uL — ABNORMAL HIGH (ref 0.00–0.07)
Basophils Absolute: 0.1 K/uL (ref 0.0–0.1)
Basophils Absolute: 0.1 K/uL (ref 0.0–0.1)
Basophils Relative: 1 %
Basophils Relative: 1 %
Eosinophils Absolute: 0.4 K/uL (ref 0.0–0.5)
Eosinophils Absolute: 0.3 K/uL (ref 0.0–0.5)
Eosinophils Relative: 3 %
Eosinophils Relative: 2 %
HCT: 47.1 % (ref 39.0–52.0)
HCT: 46.3 % (ref 39.0–52.0)
Hemoglobin: 15.7 g/dL (ref 13.0–17.0)
Hemoglobin: 15.1 g/dL (ref 13.0–17.0)
Immature Granulocytes: 1 %
Immature Granulocytes: 1 %
Lymphocytes Relative: 23 %
Lymphocytes Relative: 21 %
Lymphs Abs: 2.9 K/uL (ref 0.7–4.0)
Lymphs Abs: 3.1 K/uL (ref 0.7–4.0)
MCH: 29.6 pg (ref 26.0–34.0)
MCH: 29.4 pg (ref 26.0–34.0)
MCHC: 33.3 g/dL (ref 30.0–36.0)
MCHC: 32.6 g/dL (ref 30.0–36.0)
MCV: 88.9 fL (ref 80.0–100.0)
MCV: 90.3 fL (ref 80.0–100.0)
Monocytes Absolute: 1.3 K/uL — ABNORMAL HIGH (ref 0.1–1.0)
Monocytes Absolute: 1.5 K/uL — ABNORMAL HIGH (ref 0.1–1.0)
Monocytes Relative: 10 %
Monocytes Relative: 10 %
Neutro Abs: 8.1 K/uL — ABNORMAL HIGH (ref 1.7–7.7)
Neutro Abs: 9.6 K/uL — ABNORMAL HIGH (ref 1.7–7.7)
Neutrophils Relative %: 62 %
Neutrophils Relative %: 65 %
Platelets: 347 K/uL (ref 150–400)
Platelets: 359 K/uL (ref 150–400)
RBC: 5.3 MIL/uL (ref 4.22–5.81)
RBC: 5.13 MIL/uL (ref 4.22–5.81)
RDW: 12.1 % (ref 11.5–15.5)
RDW: 12.1 % (ref 11.5–15.5)
WBC: 12.9 K/uL — ABNORMAL HIGH (ref 4.0–10.5)
WBC: 14.7 K/uL — ABNORMAL HIGH (ref 4.0–10.5)
nRBC: 0 % (ref 0.0–0.2)
nRBC: 0 % (ref 0.0–0.2)

## 2023-11-02 LAB — VALPROIC ACID LEVEL: Valproic Acid Lvl: 16 ug/mL — ABNORMAL LOW (ref 50–100)

## 2023-11-02 LAB — TROPONIN I (HIGH SENSITIVITY)
Troponin I (High Sensitivity): 5 ng/L (ref <18)
Troponin I (High Sensitivity): 4 ng/L (ref <18)

## 2023-11-02 LAB — AMMONIA: Ammonia: 32 umol/L (ref 9–35)

## 2023-11-02 LAB — MAGNESIUM: Magnesium: 1.9 mg/dL (ref 1.7–2.4)

## 2023-11-02 LAB — ETHANOL: Alcohol, Ethyl (B): <15 mg/dL (ref <15)

## 2023-11-02 MED ORDER — SODIUM CHLORIDE 0.9 % IV BOLUS
1000.0000 mL | Freq: Once | INTRAVENOUS | Status: AC
  Administered 2023-11-02: 1000 mL via INTRAVENOUS

## 2023-11-02 MED ORDER — GADOBUTROL 1 MMOL/ML IV SOLN
8.5000 mL | Freq: Once | INTRAVENOUS | Status: AC | PRN
  Administered 2023-11-02: 8.5 mL via INTRAVENOUS

## 2023-11-02 NOTE — ED Notes (Signed)
 PT about to be transported to MRI. All cords removed at this time

## 2023-11-02 NOTE — ED Notes (Signed)
 Patient Placement notified of patient leaving AMA.

## 2023-11-02 NOTE — ED Provider Notes (Signed)
 [place] EMERGENCY DEPARTMENT AT [facility] Provider Note   CSN: [id number] Arrival date & time: 11/02/23  1426     Patient presents with: sz like activity   Mark [name] is a 33 y.o. male.   The history is provided by the patient and medical records. No language interpreter was used.  Altered Mental Status Presenting symptoms: no confusion   Presenting symptoms comment:  Episode of recurrent seizure vs syncope today  Severity:  Severe Most recent episode:  Today Episode history:  Multiple Duration: several minutes per family. Timing:  Sporadic Progression:  Waxing and waning Chronicity:  New Associated symptoms: no abdominal pain, no agitation, no fever, no hallucinations, no headaches, no light-headedness, no nausea, no slurred speech, no visual change, no vomiting and no weakness  Seizures: unknown.      Prior to Admission medications   Medication Sig Start Date End Date Taking? Authorizing Provider  cefdinir  (OMNICEF ) 300 MG capsule Take 1 capsule (300 mg total) by mouth 2 (two) times daily. 09/04/14   Jerri Keys, MD  cephALEXin  (KEFLEX ) 500 MG capsule Take 1 capsule (500 mg total) by mouth 4 (four) times daily. 04/08/23   Kingsley, Victoria K, DO  divalproex  (DEPAKOTE  ER) 500 MG 24 hr tablet Take 4 tablets (2,000 mg total) by mouth at bedtime. 08/13/23 11/11/23  Lynnette Barter, MD  doxycycline  (VIBRAMYCIN ) 100 MG capsule Take 1 capsule (100 mg total) by mouth 2 (two) times daily. 10/27/23   Curatolo, Adam, DO  FLUoxetine  (PROZAC ) 20 MG capsule Take 1 capsule (20 mg total) by mouth daily. 08/13/23 11/11/23  Lynnette Barter, MD  naproxen  (NAPROSYN ) 500 MG tablet Take 1 tablet (500 mg total) by mouth 2 (two) times daily. 06/10/21   Henderly, Britni A, PA-C  topiramate  (TOPAMAX ) 100 MG tablet Take 100 mg by mouth 2 (two) times daily.    [provider]  traZODone  (DESYREL ) 100 MG tablet Take 2 tablets by mouth at bedtime. 07/14/21   [provider]     Allergies: Other    Review of Systems  Constitutional:  Negative for chills, diaphoresis, fatigue and fever.  HENT:  Negative for congestion.   Respiratory:  Negative for cough, chest tightness, shortness of breath and wheezing.   Cardiovascular:  Negative for chest pain.  Gastrointestinal:  Negative for abdominal pain, constipation, diarrhea, nausea and vomiting.  Genitourinary:  Negative for flank pain.  Musculoskeletal:  Negative for back pain, neck pain and neck stiffness.  Skin:  Positive for wound (on r leg).  Neurological:  Positive for syncope (possible vs seizure). Negative for weakness, light-headedness, numbness and headaches. Seizures: unknown. Psychiatric/Behavioral:  Negative for agitation, confusion and hallucinations.     Updated Vital Signs BP 97/77 (BP Location: Left Arm)   Pulse 78   Temp 98 F (36.7 C) (Oral)   Resp 17   Ht 6' 2 (1.88 m)   Wt 83.9 kg   SpO2 98%   BMI 23.75 kg/m   Physical Exam Vitals and nursing note reviewed.  Constitutional:      General: He is not in acute distress.    Appearance: He is well-developed. He is not ill-appearing, toxic-appearing or diaphoretic.  HENT:     Head: Normocephalic and atraumatic.     Nose: No congestion or rhinorrhea.     Mouth/Throat:     Mouth: Mucous membranes are dry.     Pharynx: No oropharyngeal exudate or posterior oropharyngeal erythema.  Eyes:     Extraocular Movements: Extraocular  movements intact.     Conjunctiva/sclera: Conjunctivae normal.     Pupils: Pupils are equal, round, and reactive to light.  Cardiovascular:     Rate and Rhythm: Normal rate and regular rhythm.     Heart sounds: No murmur heard. Pulmonary:     Effort: Pulmonary effort is normal. No respiratory distress.     Breath sounds: Normal breath sounds. No wheezing, rhonchi or rales.  Chest:     Chest wall: No tenderness.  Abdominal:     Palpations: Abdomen is soft.     Tenderness: There is no abdominal tenderness.  There is no right CVA tenderness, left CVA tenderness, guarding or rebound.  Musculoskeletal:        General: No swelling or tenderness.     Cervical back: Neck supple.  Skin:    General: Skin is warm and dry.     Capillary Refill: Capillary refill takes less than 2 seconds.     Findings: Erythema (on R leg) and rash present.  Neurological:     General: No focal deficit present.     Mental Status: He is alert. Mental status is at baseline.     Cranial Nerves: No cranial nerve deficit.     Sensory: No sensory deficit.     Motor: No weakness.     Coordination: Coordination normal.  Psychiatric:        Mood and Affect: Mood normal.     (all labs ordered are listed, but only abnormal results are displayed) Labs Reviewed  CBC WITH DIFFERENTIAL/PLATELET - Abnormal; Notable for the following components:      Result Value   WBC 14.7 (*)    Neutro Abs 9.6 (*)    Monocytes Absolute 1.5 (*)    Abs Immature Granulocytes 0.11 (*)    All other components within normal limits  COMPREHENSIVE METABOLIC PANEL WITH GFR - Abnormal; Notable for the following components:   Total Protein 6.0 (*)    Albumin 3.2 (*)    All other components within normal limits  AMMONIA  MAGNESIUM  RAPID URINE DRUG SCREEN, HOSP PERFORMED  URINALYSIS, W/ REFLEX TO CULTURE (INFECTION SUSPECTED)  TROPONIN I (HIGH SENSITIVITY)  TROPONIN I (HIGH SENSITIVITY)    EKG: None  Radiology: DG Chest Portable 1 View Result Date: 11/02/2023 CLINICAL DATA:  Syncope. EXAM: PORTABLE CHEST 1 VIEW COMPARISON:  June 10, 2021. FINDINGS: The heart size and mediastinal contours are within normal limits. Both lungs are clear. The visualized skeletal structures are unremarkable. IMPRESSION: No active disease. Electronically Signed   By: Lynwood Landy Raddle M.D.   On: 11/02/2023 16:19   CT Cervical Spine Wo Contrast Result Date: 11/02/2023 CLINICAL DATA:  33 year old male with syncope, Blacked-out in [place] parking lot and fell. EXAM:  CT CERVICAL SPINE WITHOUT CONTRAST TECHNIQUE: Multidetector CT imaging of the cervical spine was performed without intravenous contrast. Multiplanar CT image reconstructions were also generated. RADIATION DOSE REDUCTION: This exam was performed according to the departmental dose-optimization program which includes automated exposure control, adjustment of the mA and/or kV according to patient size and/or use of iterative reconstruction technique. COMPARISON:  CT head and face today.  Cervical spine CT 07/27/2010. FINDINGS: Alignment: Stable straightening of cervical lordosis. Cervicothoracic junction alignment is within normal limits. Bilateral posterior element alignment is within normal limits. Skull base and vertebrae: Bone mineralization is within normal limits. Visualized skull base is intact. No atlanto-occipital dissociation. C1 and C2 appear intact and aligned. No acute osseous abnormality identified. Soft tissues and  spinal canal: No prevertebral fluid or swelling. No visible canal hematoma. Negative visible noncontrast neck soft tissues. Disc levels: Very mild cervical disc and endplate degeneration. No CT evidence of spinal stenosis. Upper chest: Upper thoracic anterior endplate spurring. Visible upper thoracic levels appear intact. Negative upper lungs, noncontrast superior mediastinum. IMPRESSION: No acute traumatic injury identified in the cervical spine. Electronically Signed   By: VEAR Hurst M.D.   On: 11/02/2023 09:04   CT Maxillofacial WO CM Result Date: 11/02/2023 CLINICAL DATA:  33 year old male with syncope, Blacked-out in [place] parking lot and fell. EXAM: CT MAXILLOFACIAL WITHOUT CONTRAST TECHNIQUE: Multidetector CT imaging of the maxillofacial structures was performed. Multiplanar CT image reconstructions were also generated. RADIATION DOSE REDUCTION: This exam was performed according to the departmental dose-optimization program which includes automated exposure control, adjustment of  the mA and/or kV according to patient size and/or use of iterative reconstruction technique. COMPARISON:  CT head and cervical spine today. FINDINGS: Osseous: Mild motion artifact. Mandible appears intact and normally located. Bilateral maxilla, zygoma, pterygoid bones, nasal bones intact. Central skull base appears intact. Orbits: Mild motion artifact, no orbit wall fracture. Globes and intraorbital soft tissues appears symmetric and negative. Sinuses: Well aerated bilaterally. Soft tissues: Negative visible noncontrast larynx, pharynx, parapharyngeal spaces, retropharyngeal space, sublingual space, submandibular spaces, masticator and parotid spaces. No superficial soft tissue injury identified. Limited intracranial: Stable to that reported separately. IMPRESSION: Negative noncontrast Face CT when allowing for mild motion. Electronically Signed   By: VEAR Hurst M.D.   On: 11/02/2023 09:02   CT Head Wo Contrast Result Date: 11/02/2023 CLINICAL DATA:  33 year old male with syncope, Blacked-out in [place] parking lot and fell. EXAM: CT HEAD WITHOUT CONTRAST TECHNIQUE: Contiguous axial images were obtained from the base of the skull through the vertex without intravenous contrast. RADIATION DOSE REDUCTION: This exam was performed according to the departmental dose-optimization program which includes automated exposure control, adjustment of the mA and/or kV according to patient size and/or use of iterative reconstruction technique. COMPARISON:  None Available. FINDINGS: Brain: Cerebral volume remains normal. No midline shift, ventriculomegaly, mass effect, evidence of mass lesion, intracranial hemorrhage or evidence of cortically based acute infarction. Gray-white matter differentiation is within normal limits throughout the brain. Vascular: No suspicious intracranial vascular hyperdensity. Skull: Intact and stable. Sinuses/Orbits: Visualized paranasal sinuses and mastoids are stable and well aerated. Other: No  orbit or scalp soft tissue injury identified. IMPRESSION: Stable and normal noncontrast Head CT. Electronically Signed   By: VEAR Hurst M.D.   On: 11/02/2023 08:57     Procedures   Medications Ordered in the ED  gadobutrol  (GADAVIST ) 1 MMOL/ML injection 8.5 mL (has no administration in time range)  sodium chloride  0.9 % bolus 1,000 mL (0 mLs Intravenous Stopped 11/02/23 1758)                                    Medical Decision Making Amount and/or Complexity of Data Reviewed Labs: ordered. Radiology: ordered.  Risk Prescription drug management. Decision regarding hospitalization.    Mark [name] is a 33 y.o. male with a past medical history significant for general anxiety disorder, PTSD, and intellectual disability who presents for unresponsive episode that was felt to be likely seizure versus syncope.  According to patient, early this morning he got to an argument with his significant other at a [employer] and during the yelling he had a passing out  episode that was unclear this is a seizure.  He came this morning to this emergency department and had CT imaging his head, neck and face that did not show significant traumatic injury.  Other workup was reassuring and he was feeling better so he was discharged home.  He reports he again got into an argument this afternoon and once again had an episode where he was unresponsive and shaking.  He did not bite his tongue and did not urinate on himself.  He has no personal history of seizures but a strong family history with his mother having seizures that began in her 10s and he is concerned about that.  Otherwise he denies any recent alcohol use or withdrawal or other medication changes.  He denies any new trauma and reports he did not have a fall during the second episodes.  He was guided to the ground by significant other.  She reports that he shook all over and was unresponsive for several minutes and by the time EMS got there he was still  somewhat altered.  He remembers waking back up on the way here and does not member everything that happened.  On exam, lungs clear.  Chest nontender.  Abdomen nontender.  Patient had no focal neurologic deficits on my exam.  Symmetric smile.  Clear speech.  Pupil symmetric and reactive with normal extract movements.  No significant traumatic injury seen now.  Patient does report he was on antibiotic recently for cellulitis in the right foot and ended up getting a burn as well on the back of the lower leg on the right.  He was wanting something else to help with this.  Clinically does not seem significantly affected right now.  Based on the patient scription symptoms I am somewhat concerned about the seizure versus syncope happening again today leading to another visit to the emergency department.  Due to the family history, I did call neurology and spoke with Dr. Michaela who recommended patient will need admission to the hospital for EEG and further evaluation.  He did want MRI brain with and without contrast and he will need the EEG.  He thinks giving 2 episodes in 1 day spaced out by such amount of time, this will need admission.  Will get labs and workup started and will call for admission.  Again when patient is ultimately discharged, will defer to admitting team about starting antibiotics or changing antibiotics for the right lower leg wound.  5:52 PM Labs returned with some persistent leukocytosis but metabolic panel otherwise reassuring.  Troponin negative.  Ammonia normal.  Chest x-ray shows no active disease.  He will get the MRI and we will order the EEG and we will be admitted per neurology recommendations     Final diagnoses:  Episode of shaking  Altered mental status, unspecified altered mental status type    Clinical Impression: 1. Episode of shaking   2. Altered mental status, unspecified altered mental status type     Disposition: Admit  This note was prepared with  assistance of Dragon voice recognition software. Occasional wrong-word or sound-a-like substitutions may have occurred due to the inherent limitations of voice recognition software.      Ivy Meriwether, Lonni PARAS, MD 11/02/23 2037

## 2023-11-02 NOTE — Discharge Summary (Signed)
 Physician Discharge Summary   Patient: Mark [name] MRN: [id number] DOB: Mar 29, 1990  Admit date:     11/02/2023  Discharge date: 11/02/23 Left AMA in stable condition  Discharge Physician: Bernardino KATHEE Come   PCP: Shelda Atlas, MD   Recommendations at discharge:  Follow up brain MRI results (pending at time patient left) Neurology referral and EEG ASAP  Discharge Diagnoses: Principal Problem:   Syncope  Hospital Course: HPI: Mark [name] is a 33 y.o. male with a history of PTSD, anxiety, tobacco use who presented to the ED for the 2nd time today after an episode of unresponsiveness. History is from EDP and brief interview with patient before he left AMA.    He has been diagnosed with anxiety and PTSD, has been taking stable medications since April including trazodone , depakote  (no changes in dosing), and prozac . He takes no benzodiazepines and has not recently stopped or started any other medications.    He reports no history of seizures, though his mother has seizures which were diagnosed in her 4's. 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 Does not drink alcohol or do any illicit drugs. +tobacco.   Physical Exam:       Vitals:    11/02/23 1438 11/02/23 1439 11/02/23 1605 11/02/23 1934  BP: 97/77   107/60 127/77  Pulse: 78 78 70 70  Resp: 17   16 17   Temp:     98 F (36.7 C) 98 F (36.7 C)  TempSrc:     Oral Oral  SpO2: 99% 98% 98% 100%  Weight:          Height:          Gen: Tall male ambulating in room/hallway in no distress, calm. HEENT: No lingual laceration Pulm: Clear, nonlabored  CV: RRR, no MRG or edema GI: Soft, NT, ND, +BS Neuro: Alert and oriented, normal narrow based gait, normal speech. No new focal deficits. Ext: Warm, no deformities. Skin: Left anterior shin keloid, right posterior leg with eschar, no exudate.   Data Reviewed: WBC 14.7k, afebrile. Tn 5 > 4.  CMP  unremarkable.  Ammonia normal.  ECG: Regular rate, NSR, normal axis, no significant abnormalities  CXR: Negative CT head from prior ED visit: Stable and normal head CT.    Assessment and Plan: Syncope vs. seizure vs. PNES: Some concerning features for true seizure, though this is not certain. Neurology recommended EEG, MRI, and overnight observation. The patient reports having a PCP in [facility] system and would prefer outpatient work up. He feels completely normal and volunteers the plan to return for care if this happens again.  - Pt aware of legislation stating he may not drive for 6 months.  - Continue home medications including depakote . Note topiramate  is on med list but this is a remote medication. He takes no benzodiazepines. No BZDs on PDMP review. There is gabapentin  200mg  #90 for 30 days supply prescribed 5/31 and 4/30, none since. Pt denies taking this medication and denies recently stopping it.  - Urged expedited follow up with PCP for neurology referral, EEG, and follow up of MRI (radiologist interpretation is pending at time patient is leaving AMA).  - Return precautions reviewed. The patient is currently able to state risks and benefits of hospitalization as well as leaving tonight prior to the recommended measures above.   Consultants: Neurology Procedures performed: None  Disposition: Home Diet recommendation: As tolerated  Condition at discharge: stable  The results of significant diagnostics from this hospitalization (including imaging, microbiology, ancillary and laboratory) are listed below for reference.   Imaging Studies: DG Chest Portable 1 View Result Date: 11/02/2023 CLINICAL DATA:  Syncope. EXAM: PORTABLE CHEST 1 VIEW COMPARISON:  June 10, 2021. FINDINGS: The heart size and mediastinal contours are within normal limits. Both lungs are clear. The visualized skeletal structures are unremarkable. IMPRESSION: No active disease. Electronically Signed   By: Lynwood Landy Raddle M.D.   On: 11/02/2023 16:19   CT Cervical Spine Wo Contrast Result Date: 11/02/2023 CLINICAL DATA:  33 year old male with syncope, Blacked-out in [place] parking lot and fell. EXAM: CT CERVICAL SPINE WITHOUT CONTRAST TECHNIQUE: Multidetector CT imaging of the cervical spine was performed without intravenous contrast. Multiplanar CT image reconstructions were also generated. RADIATION DOSE REDUCTION: This exam was performed according to the departmental dose-optimization program which includes automated exposure control, adjustment of the mA and/or kV according to patient size and/or use of iterative reconstruction technique. COMPARISON:  CT head and face today.  Cervical spine CT 07/27/2010. FINDINGS: Alignment: Stable straightening of cervical lordosis. Cervicothoracic junction alignment is within normal limits. Bilateral posterior element alignment is within normal limits. Skull base and vertebrae: Bone mineralization is within normal limits. Visualized skull base is intact. No atlanto-occipital dissociation. C1 and C2 appear intact and aligned. No acute osseous abnormality identified. Soft tissues and spinal canal: No prevertebral fluid or swelling. No visible canal hematoma. Negative visible noncontrast neck soft tissues. Disc levels: Very mild cervical disc and endplate degeneration. No CT evidence of spinal stenosis. Upper chest: Upper thoracic anterior endplate spurring. Visible upper thoracic levels appear intact. Negative upper lungs, noncontrast superior mediastinum. IMPRESSION: No acute traumatic injury identified in the cervical spine. Electronically Signed   By: VEAR Hurst M.D.   On: 11/02/2023 09:04   CT Maxillofacial WO CM Result Date: 11/02/2023 CLINICAL DATA:  33 year old male with syncope, Blacked-out in [place] parking lot and fell. EXAM: CT MAXILLOFACIAL WITHOUT CONTRAST TECHNIQUE: Multidetector CT imaging of the maxillofacial structures was performed. Multiplanar CT image  reconstructions were also generated. RADIATION DOSE REDUCTION: This exam was performed according to the departmental dose-optimization program which includes automated exposure control, adjustment of the mA and/or kV according to patient size and/or use of iterative reconstruction technique. COMPARISON:  CT head and cervical spine today. FINDINGS: Osseous: Mild motion artifact. Mandible appears intact and normally located. Bilateral maxilla, zygoma, pterygoid bones, nasal bones intact. Central skull base appears intact. Orbits: Mild motion artifact, no orbit wall fracture. Globes and intraorbital soft tissues appears symmetric and negative. Sinuses: Well aerated bilaterally. Soft tissues: Negative visible noncontrast larynx, pharynx, parapharyngeal spaces, retropharyngeal space, sublingual space, submandibular spaces, masticator and parotid spaces. No superficial soft tissue injury identified. Limited intracranial: Stable to that reported separately. IMPRESSION: Negative noncontrast Face CT when allowing for mild motion. Electronically Signed   By: VEAR Hurst M.D.   On: 11/02/2023 09:02   CT Head Wo Contrast Result Date: 11/02/2023 CLINICAL DATA:  32 year old male with syncope, Blacked-out in [place] parking lot and fell. EXAM: CT HEAD WITHOUT CONTRAST TECHNIQUE: Contiguous axial images were obtained from the base of the skull through the vertex without intravenous contrast. RADIATION DOSE REDUCTION: This exam was performed according to the departmental dose-optimization program which includes automated exposure control, adjustment of the mA and/or kV according to patient size and/or use of iterative reconstruction technique. COMPARISON:  None Available. FINDINGS: Brain: Cerebral volume remains normal. No  midline shift, ventriculomegaly, mass effect, evidence of mass lesion, intracranial hemorrhage or evidence of cortically based acute infarction. Gray-white matter differentiation is within normal limits  throughout the brain. Vascular: No suspicious intracranial vascular hyperdensity. Skull: Intact and stable. Sinuses/Orbits: Visualized paranasal sinuses and mastoids are stable and well aerated. Other: No orbit or scalp soft tissue injury identified. IMPRESSION: Stable and normal noncontrast Head CT. Electronically Signed   By: VEAR Hurst M.D.   On: 11/02/2023 08:57   DG Ankle Complete Right Result Date: 10/27/2023 CLINICAL DATA:  Ankle pain EXAM: RIGHT ANKLE - COMPLETE 3+ VIEW COMPARISON:  04/08/2023, CT 09/01/2021 FINDINGS: Lateral soft tissue swelling. Chronic osteochondral lesion at the medial talar dome. Mild tibiotalar degenerative changes. No acute osseous abnormality IMPRESSION: 1. Lateral soft tissue swelling. No acute osseous abnormality. 2. Small chronic osteochondral lesion at the medial talar dome. Electronically Signed   By: Luke Bun M.D.   On: 10/27/2023 20:31   DG Foot Complete Right Result Date: 10/27/2023 CLINICAL DATA:  Foot and ankle pain history of surgery EXAM: RIGHT FOOT COMPLETE - 3+ VIEW COMPARISON:  04/08/2023, 09/01/2021 FINDINGS: No acute fracture is seen. Moderate hallux valgus deformity at the first MTP joint with moderate bunion head of first metatarsal. Old healed fracture deformities of the second through fourth metatarsals. Moderate degenerative changes at the midfoot. Metallic fragment in the first cuneiform bone as before. Soft tissues are unremarkable. Skin dimpling versus small ulcer lateral aspect of the foot at the level of proximal fifth metatarsal. IMPRESSION: 1. No acute osseous abnormality. 2. Moderate hallux valgus deformity at the first MTP joint with moderate bunion head of first metatarsal. 3. Post-traumatic and postsurgical changes related to history of prior Lisfranc fracture dislocation 4. Cutaneous dimple or small ulcer lateral soft tissues at the level of proximal fifth metatarsal, correlate with direct inspection Electronically Signed   By: Luke Bun  M.D.   On: 10/27/2023 20:28    Microbiology: Results for orders placed or performed during the hospital encounter of 04/08/23  Blood culture (routine x 2)     Status: None   Collection Time: 04/08/23  2:37 PM   Specimen: BLOOD  Result Value Ref Range Status   Specimen Description BLOOD LEFT ANTECUBITAL  Final   Special Requests   Final    BOTTLES DRAWN AEROBIC AND ANAEROBIC Blood Culture results may not be optimal due to an inadequate volume of blood received in culture bottles   Culture   Final    NO GROWTH 5 DAYS Performed at [facility], [address], [place], [place] [address]    Report Status 04/13/2023 FINAL  Final    Labs: CBC: Recent Labs  Lab 11/02/23 0828 11/02/23 1651  WBC 12.9* 14.7*  NEUTROABS 8.1* 9.6*  HGB 15.7 15.1  HCT 47.1 46.3  MCV 88.9 90.3  PLT 347 359   Basic Metabolic Panel: Recent Labs  Lab 11/02/23 0828 11/02/23 1651  NA 140 144  K 4.0 4.5  CL 104 111  CO2 26 24  GLUCOSE 160* 79  BUN 11 12  CREATININE 0.91 0.84  CALCIUM 9.1 9.1  MG  --  1.9   Liver Function Tests: Recent Labs  Lab 11/02/23 0828 11/02/23 1651  AST 26 21  ALT 22 21  ALKPHOS 52 46  BILITOT 0.5 0.6  PROT 6.0* 6.0*  ALBUMIN 3.1* 3.2*   CBG: No results for input(s): GLUCAP in the last 168 hours.  Discharge time spent: greater than 30 minutes.  Signed: Bernardino KATHEE Come, MD [facility] 11/02/2023

## 2023-11-02 NOTE — H&P (Signed)
 History and Physical    Patient: Mark [name] FMW:[id number] DOB: [birth year] DOA: 11/02/2023 DOS: the patient was seen and examined on 11/02/2023 PCP: Shelda Atlas, MD  Patient coming from: Home  Chief Complaint:  Chief Complaint  Patient presents with   sz like activity   HPI: Mark [name] is a 33 y.o. male with a history of PTSD, anxiety, tobacco use who presented to the ED for the 2nd time today after an episode of unresponsiveness. History is from EDP and brief interview with patient before he left AMA.   He has been diagnosed with anxiety and PTSD, has been taking stable medications since April including trazodone , depakote  (no changes in dosing), and prozac . He takes no benzodiazepines and has not recently stopped or started any other medications.   He reports no history of seizures, though his mother has seizures which were diagnosed in her 52's. 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 Does not drink alcohol or do any illicit drugs. +tobacco.   Review of Systems: Unable to review all systems due to lack of cooperation from patient. Past Medical History:  Diagnosis Date   ADHD (attention deficit hyperactivity disorder)    Bipolar 1 disorder (HCC)    Mental retardation    History reviewed. No pertinent surgical history. Social History:  reports that he has been smoking. He does not have any smokeless tobacco history on file. He reports that he does not drink alcohol and does not use drugs.  Allergies  Allergen Reactions   Other     Chinese food--used benadryl     Family History  Problem Relation Age of Onset   Hypertension Mother    Hyperlipidemia Mother     Prior to Admission medications   Medication Sig Start Date End Date Taking? Authorizing Provider  cefdinir  (OMNICEF ) 300 MG capsule Take 1 capsule (300 mg total) by mouth 2 (two) times daily. 09/04/14   Jerri Keys, MD   cephALEXin  (KEFLEX ) 500 MG capsule Take 1 capsule (500 mg total) by mouth 4 (four) times daily. 04/08/23   Kingsley, Victoria K, DO  divalproex  (DEPAKOTE  ER) 500 MG 24 hr tablet Take 4 tablets (2,000 mg total) by mouth at bedtime. 08/13/23 11/11/23  Lynnette Barter, MD  doxycycline  (VIBRAMYCIN ) 100 MG capsule Take 1 capsule (100 mg total) by mouth 2 (two) times daily. 10/27/23   Curatolo, Adam, DO  FLUoxetine  (PROZAC ) 20 MG capsule Take 1 capsule (20 mg total) by mouth daily. 08/13/23 11/11/23  Lynnette Barter, MD  naproxen  (NAPROSYN ) 500 MG tablet Take 1 tablet (500 mg total) by mouth 2 (two) times daily. 06/10/21   Henderly, Britni A, PA-C  topiramate  (TOPAMAX ) 100 MG tablet Take 100 mg by mouth 2 (two) times daily.    [provider]  traZODone  (DESYREL ) 100 MG tablet Take 2 tablets by mouth at bedtime. 07/14/21   [provider]    Physical Exam: Vitals:   11/02/23 1438 11/02/23 1439 11/02/23 1605 11/02/23 1934  BP: 97/77  107/60 127/77  Pulse: 78 78 70 70  Resp: 17  16 17   Temp:   98 F (36.7 C) 98 F (36.7 C)  TempSrc:   Oral Oral  SpO2: 99% 98% 98% 100%  Weight:      Height:      Gen: Tall male ambulating in room/hallway in no distress, calm. HEENT: No lingual laceration Pulm: Clear, nonlabored  CV: RRR, no MRG  or edema GI: Soft, NT, ND, +BS Neuro: Alert and oriented, normal narrow based gait, normal speech. No new focal deficits. Ext: Warm, no deformities. Skin: Left anterior shin keloid, right posterior leg with eschar, no exudate.  Data Reviewed: WBC 14.7k, afebrile. Tn 5 > 4.  CMP unremarkable.  Ammonia normal.  ECG: Regular rate, NSR, normal axis, no significant abnormalities  CXR: Negative CT head from prior ED visit: Stable and normal head CT.   Assessment and Plan: Syncope vs. seizure vs. PNES: Some concerning features for true seizure, though this is not certain. Neurology recommended EEG, MRI, and overnight observation. The patient reports having a PCP in  [facility] system and would prefer outpatient work up. He feels completely normal and volunteers the plan to return for care if this happens again.  - Pt aware of legislation stating he may not drive for 6 months.  - Continue home medications including depakote . Note topiramate  is on med list but this is a remote medication. He takes no benzodiazepines. No BZDs on PDMP review. There is gabapentin  200mg  #90 for 30 days supply prescribed 5/31 and 4/30, none since. Pt denies taking this medication and denies recently stopping it.  - Urged expedited follow up with PCP for neurology referral, EEG, and follow up of MRI (radiologist interpretation is pending at time patient is leaving AMA).  - Return precautions reviewed. The patient is currently able to state risks and benefits of hospitalization as well as leaving tonight prior to the recommended measures above.     Author: Bernardino KATHEE Come, MD 11/02/2023 8:09 PM  For on call review [email or web address].

## 2023-11-02 NOTE — Discharge Instructions (Signed)
 You were seen after a fall/unresponsive episode.  You had a CAT scan of your head face and neck along with some basic lab work.  You do have a burn on your leg that you should be keeping clean with soap and water and applying some antibacterial ointment like bacitracin or Neosporin.  Follow-up with your regular doctor.  Return to the emergency department if any worsening or concerning symptoms

## 2023-11-02 NOTE — ED Notes (Signed)
 PT did not want to be admitted and wanted to leave AMA. PT was advised about the importance of not leaving AMA and was told that if he did go AMA that his bed would be given up.PT verbalized understanding. IV was removed, physicians notified and PT left.

## 2023-11-02 NOTE — ED Notes (Signed)
 Patient notified staff of wanting to leave AMA. Patient educated on risk of leaving before discharge. Admitting provider made aware.

## 2023-11-02 NOTE — H&P (Incomplete)
 History and Physical    Patient: Mark [name] FMW:[id number] DOB: [birth year] DOA: 11/02/2023 DOS: the patient was seen and examined on 11/02/2023 PCP: Shelda Atlas, MD  Patient coming from: {Point_of_Origin:[address]}  Chief Complaint:  Chief Complaint  Patient presents with   sz like activity   HPI: Mark [name] is a 33 y.o. male with medical history significant of ***  Review of Systems: {ROS_Text:26778} Past Medical History:  Diagnosis Date   ADHD (attention deficit hyperactivity disorder)    Bipolar 1 disorder (HCC)    Mental retardation    History reviewed. No pertinent surgical history. Social History:  reports that he has been smoking. He does not have any smokeless tobacco history on file. He reports that he does not drink alcohol and does not use drugs.  Allergies  Allergen Reactions   Other     Chinese food--used benadryl     Family History  Problem Relation Age of Onset   Hypertension Mother    Hyperlipidemia Mother     Prior to Admission medications   Medication Sig Start Date End Date Taking? Authorizing Provider  cefdinir  (OMNICEF ) 300 MG capsule Take 1 capsule (300 mg total) by mouth 2 (two) times daily. 09/04/14   Jerri Keys, MD  cephALEXin  (KEFLEX ) 500 MG capsule Take 1 capsule (500 mg total) by mouth 4 (four) times daily. 04/08/23   Kingsley, Victoria K, DO  divalproex  (DEPAKOTE  ER) 500 MG 24 hr tablet Take 4 tablets (2,000 mg total) by mouth at bedtime. 08/13/23 11/11/23  Lynnette Barter, MD  doxycycline  (VIBRAMYCIN ) 100 MG capsule Take 1 capsule (100 mg total) by mouth 2 (two) times daily. 10/27/23   Curatolo, Adam, DO  FLUoxetine  (PROZAC ) 20 MG capsule Take 1 capsule (20 mg total) by mouth daily. 08/13/23 11/11/23  Lynnette Barter, MD  naproxen  (NAPROSYN ) 500 MG tablet Take 1 tablet (500 mg total) by mouth 2 (two) times daily. 06/10/21   Henderly, Britni A, PA-C  topiramate  (TOPAMAX ) 100 MG tablet Take 100 mg by mouth 2 (two) times daily.    [provider]   traZODone  (DESYREL ) 100 MG tablet Take 2 tablets by mouth at bedtime. 07/14/21   [provider]    Physical Exam: Vitals:   11/02/23 1434 11/02/23 1438 11/02/23 1439 11/02/23 1605  BP:  97/77  107/60  Pulse: 77 78 78 70  Resp: 18 17  16   Temp: 98 F (36.7 C)   98 F (36.7 C)  TempSrc: Oral   Oral  SpO2: 99% 99% 98% 98%  Weight:      Height:       *** Data Reviewed: {Tip this will not be part of the note when signed- Document your independent interpretation of telemetry tracing, EKG, lab, Radiology test or any other diagnostic tests. Add any new diagnostic test ordered today. (Optional):26781} {Results:26384}  Assessment and Plan: No notes have been filed under this hospital service. Service: Hospitalist  Neurology felt story was weird enough, seizure vs. syncope and recommended observation.   Worked up in argument with SO in [place] when he passed out. Happened again at home unresponsive and shaking all over. [place], 2 spells = EEG and MRI. Hyperfocused on mother who Dx w/seizures in mid-30's. Nonepileptogenic activity / conversaion. amnesia, slow to return to normal per EMS.  - Depakote , topamax , trazodone , prozac . ??   Advance Care Planning:   Code Status: Prior ***  Consults: ***  Family Communication: ***  Severity of Illness: {Observation/Inpatient:21159}  Author: Bernardino KATHEE Come, MD  11/02/2023 6:34 PM  For on call review [email or web address].

## 2023-11-02 NOTE — ED Triage Notes (Signed)
 Pt BIb PTAR.  Drank an energy drink which he is reportedly not supposed to have.  He blacked out in the [employer] parking lot and fell. Initially responsive to pain on scene.  Has become more responsive en route.  Pupils reported to be pinpoint on scene. No thinners. No hx of seizures.  No obvious injuries but complains of 5/5 pain to right face.    136/86 98% HR 126 CBG 168 RR 24  Hx of bipolar/ADD/PTSD/MR per pt.  Takes  trazadone and depakote  per pt.
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
## 2023-11-02 NOTE — ED Notes (Signed)
 Called and placed PT on monitor with CCMD

## 2023-11-05 ENCOUNTER — Other Ambulatory Visit: Payer: Self-pay

## 2023-11-05 ENCOUNTER — Encounter (HOSPITAL_COMMUNITY): Payer: Self-pay | Admitting: Family Medicine

## 2023-11-05 ENCOUNTER — Inpatient Hospital Stay (HOSPITAL_COMMUNITY)
Admission: AD | Admit: 2023-11-05 | Discharge: 2023-11-10 | DRG: 885 | Disposition: A | Discharge status: Home / Self Care | Source: Intra-hospital | Attending: Psychiatry | Admitting: Psychiatry

## 2023-11-05 ENCOUNTER — Ambulatory Visit (HOSPITAL_COMMUNITY)
Admission: EM | Admit: 2023-11-05 | Discharge: 2023-11-05 | Disposition: A | Discharge status: Other Acute Inpatient Hospital | Attending: Family Medicine | Admitting: Family Medicine

## 2023-11-05 DIAGNOSIS — F1290 Cannabis use, unspecified, uncomplicated: Secondary | ICD-10-CM | POA: Diagnosis present

## 2023-11-05 DIAGNOSIS — Z803 Family history of malignant neoplasm of breast: Secondary | ICD-10-CM | POA: Diagnosis not present

## 2023-11-05 DIAGNOSIS — R4589 Other symptoms and signs involving emotional state: Secondary | ICD-10-CM

## 2023-11-05 DIAGNOSIS — F639 Impulse disorder, unspecified: Secondary | ICD-10-CM | POA: Diagnosis present

## 2023-11-05 DIAGNOSIS — F17210 Nicotine dependence, cigarettes, uncomplicated: Secondary | ICD-10-CM | POA: Diagnosis present

## 2023-11-05 DIAGNOSIS — F411 Generalized anxiety disorder: Secondary | ICD-10-CM | POA: Diagnosis present

## 2023-11-05 DIAGNOSIS — Z560 Unemployment, unspecified: Secondary | ICD-10-CM | POA: Diagnosis not present

## 2023-11-05 DIAGNOSIS — F4310 Post-traumatic stress disorder, unspecified: Secondary | ICD-10-CM | POA: Diagnosis present

## 2023-11-05 DIAGNOSIS — F12980 Cannabis use, unspecified with anxiety disorder: Secondary | ICD-10-CM | POA: Diagnosis present

## 2023-11-05 DIAGNOSIS — F79 Unspecified intellectual disabilities: Secondary | ICD-10-CM | POA: Diagnosis present

## 2023-11-05 DIAGNOSIS — Z91199 Patient's noncompliance with other medical treatment and regimen due to unspecified reason: Secondary | ICD-10-CM

## 2023-11-05 DIAGNOSIS — R45851 Suicidal ideations: Secondary | ICD-10-CM | POA: Diagnosis present

## 2023-11-05 DIAGNOSIS — Z83438 Family history of other disorder of lipoprotein metabolism and other lipidemia: Secondary | ICD-10-CM

## 2023-11-05 DIAGNOSIS — Z9151 Personal history of suicidal behavior: Secondary | ICD-10-CM | POA: Diagnosis not present

## 2023-11-05 DIAGNOSIS — Z79899 Other long term (current) drug therapy: Secondary | ICD-10-CM | POA: Diagnosis not present

## 2023-11-05 DIAGNOSIS — Z818 Family history of other mental and behavioral disorders: Secondary | ICD-10-CM

## 2023-11-05 DIAGNOSIS — G629 Polyneuropathy, unspecified: Secondary | ICD-10-CM | POA: Insufficient documentation

## 2023-11-05 DIAGNOSIS — G4700 Insomnia, unspecified: Secondary | ICD-10-CM | POA: Diagnosis present

## 2023-11-05 DIAGNOSIS — Z8249 Family history of ischemic heart disease and other diseases of the circulatory system: Secondary | ICD-10-CM

## 2023-11-05 DIAGNOSIS — Z833 Family history of diabetes mellitus: Secondary | ICD-10-CM

## 2023-11-05 DIAGNOSIS — L97511 Non-pressure chronic ulcer of other part of right foot limited to breakdown of skin: Secondary | ICD-10-CM

## 2023-11-05 DIAGNOSIS — F332 Major depressive disorder, recurrent severe without psychotic features: Secondary | ICD-10-CM | POA: Insufficient documentation

## 2023-11-05 DIAGNOSIS — L03115 Cellulitis of right lower limb: Secondary | ICD-10-CM | POA: Diagnosis not present

## 2023-11-05 DIAGNOSIS — Z62810 Personal history of physical and sexual abuse in childhood: Secondary | ICD-10-CM

## 2023-11-05 DIAGNOSIS — F3160 Bipolar disorder, current episode mixed, unspecified: Principal | ICD-10-CM | POA: Diagnosis present

## 2023-11-05 DIAGNOSIS — L98491 Non-pressure chronic ulcer of skin of other sites limited to breakdown of skin: Secondary | ICD-10-CM | POA: Insufficient documentation

## 2023-11-05 DIAGNOSIS — F17290 Nicotine dependence, other tobacco product, uncomplicated: Secondary | ICD-10-CM | POA: Diagnosis present

## 2023-11-05 LAB — COMPREHENSIVE METABOLIC PANEL WITH GFR
ALT: 24 U/L (ref 0–44)
AST: 29 U/L (ref 15–41)
Albumin: 3.1 g/dL — ABNORMAL LOW (ref 3.5–5.0)
Alkaline Phosphatase: 53 U/L (ref 38–126)
Anion gap: 8 (ref 5–15)
BUN: 17 mg/dL (ref 6–20)
CO2: 24 mmol/L (ref 22–32)
Calcium: 9.1 mg/dL (ref 8.9–10.3)
Chloride: 106 mmol/L (ref 98–111)
Creatinine, Ser: 1.12 mg/dL (ref 0.61–1.24)
GFR, Estimated: >60 mL/min (ref >60)
Glucose, Bld: 103 mg/dL — ABNORMAL HIGH (ref 70–99)
Potassium: 3.9 mmol/L (ref 3.5–5.1)
Sodium: 138 mmol/L (ref 135–145)
Total Bilirubin: 0.7 mg/dL (ref 0.0–1.2)
Total Protein: 6.2 g/dL — ABNORMAL LOW (ref 6.5–8.1)

## 2023-11-05 LAB — CBC WITH DIFFERENTIAL/PLATELET
Abs Immature Granulocytes: 0.16 K/uL — ABNORMAL HIGH (ref 0.00–0.07)
Basophils Absolute: 0.1 K/uL (ref 0.0–0.1)
Basophils Relative: 1 %
Eosinophils Absolute: 0.3 K/uL (ref 0.0–0.5)
Eosinophils Relative: 1 %
HCT: 43.1 % (ref 39.0–52.0)
Hemoglobin: 14.4 g/dL (ref 13.0–17.0)
Immature Granulocytes: 1 %
Lymphocytes Relative: 10 %
Lymphs Abs: 2 K/uL (ref 0.7–4.0)
MCH: 30.1 pg (ref 26.0–34.0)
MCHC: 33.4 g/dL (ref 30.0–36.0)
MCV: 90.2 fL (ref 80.0–100.0)
Monocytes Absolute: 2.4 K/uL — ABNORMAL HIGH (ref 0.1–1.0)
Monocytes Relative: 12 %
Neutro Abs: 14.8 K/uL — ABNORMAL HIGH (ref 1.7–7.7)
Neutrophils Relative %: 75 %
Platelets: 280 K/uL (ref 150–400)
RBC: 4.78 MIL/uL (ref 4.22–5.81)
RDW: 12.5 % (ref 11.5–15.5)
WBC: 19.6 K/uL — ABNORMAL HIGH (ref 4.0–10.5)
nRBC: 0 % (ref 0.0–0.2)

## 2023-11-05 LAB — POCT URINE DRUG SCREEN - MANUAL ENTRY (I-SCREEN)
POC Amphetamine UR: NOT DETECTED
POC Buprenorphine (BUP): NOT DETECTED
POC Cocaine UR: NOT DETECTED
POC Marijuana UR: POSITIVE — AB
POC Methadone UR: NOT DETECTED
POC Methamphetamine UR: NOT DETECTED
POC Morphine: NOT DETECTED
POC Oxazepam (BZO): NOT DETECTED
POC Oxycodone UR: NOT DETECTED
POC Secobarbital (BAR): NOT DETECTED

## 2023-11-05 LAB — HEMOGLOBIN A1C
Hgb A1c MFr Bld: 5.2 % (ref 4.8–5.6)
Mean Plasma Glucose: 102.54 mg/dL

## 2023-11-05 LAB — LIPID PANEL
Cholesterol: 137 mg/dL (ref 0–200)
HDL: 40 mg/dL — ABNORMAL LOW (ref >40)
LDL Cholesterol: 77 mg/dL (ref 0–99)
Total CHOL/HDL Ratio: 3.4 ratio
Triglycerides: 102 mg/dL (ref <150)
VLDL: 20 mg/dL (ref 0–40)

## 2023-11-05 LAB — TSH: TSH: 0.691 u[IU]/mL (ref 0.350–4.500)

## 2023-11-05 LAB — VALPROIC ACID LEVEL: Valproic Acid Lvl: 93 ug/mL (ref 50–100)

## 2023-11-05 MED ORDER — HYDROXYZINE HCL 25 MG PO TABS: 25.0000 mg | ORAL_TABLET | Freq: Three times a day (TID) | ORAL | Status: DC | PRN

## 2023-11-05 MED ORDER — DIPHENHYDRAMINE HCL 50 MG PO CAPS: 50.0000 mg | ORAL_CAPSULE | Freq: Three times a day (TID) | ORAL | Status: DC | PRN

## 2023-11-05 MED ORDER — TRAZODONE HCL 100 MG PO TABS
200.0000 mg | ORAL_TABLET | Freq: Every day | ORAL | Status: DC
  Administered 2023-11-05 – 2023-11-09 (×5): 200 mg via ORAL
  Filled 2023-11-05 (×5): qty 2

## 2023-11-05 MED ORDER — LORAZEPAM 2 MG/ML IJ SOLN: 2.0000 mg | Freq: Three times a day (TID) | INTRAMUSCULAR | Status: DC | PRN

## 2023-11-05 MED ORDER — HALOPERIDOL LACTATE 5 MG/ML IJ SOLN: 10.0000 mg | Freq: Three times a day (TID) | INTRAMUSCULAR | Status: DC | PRN

## 2023-11-05 MED ORDER — HALOPERIDOL 5 MG PO TABS: 5.0000 mg | ORAL_TABLET | Freq: Three times a day (TID) | ORAL | Status: DC | PRN

## 2023-11-05 MED ORDER — DOXYCYCLINE HYCLATE 100 MG PO TABS
100.0000 mg | ORAL_TABLET | Freq: Two times a day (BID) | ORAL | Status: DC
  Administered 2023-11-05 – 2023-11-10 (×10): 100 mg via ORAL
  Filled 2023-11-05 (×10): qty 1

## 2023-11-05 MED ORDER — ALUM & MAG HYDROXIDE-SIMETH 200-200-20 MG/5ML PO SUSP: 30.0000 mL | ORAL | Status: DC | PRN

## 2023-11-05 MED ORDER — DIPHENHYDRAMINE HCL 50 MG/ML IJ SOLN
50.0000 mg | Freq: Three times a day (TID) | INTRAMUSCULAR | Status: DC | PRN
Start: 2023-11-05 — End: 2023-11-10

## 2023-11-05 MED ORDER — DIPHENHYDRAMINE HCL 50 MG/ML IJ SOLN: 50.0000 mg | Freq: Three times a day (TID) | INTRAMUSCULAR | Status: DC | PRN

## 2023-11-05 MED ORDER — ACETAMINOPHEN 325 MG PO TABS
650.0000 mg | ORAL_TABLET | Freq: Four times a day (QID) | ORAL | Status: DC | PRN
  Administered 2023-11-06 – 2023-11-08 (×6): 650 mg via ORAL
  Filled 2023-11-05 (×6): qty 2

## 2023-11-05 MED ORDER — MAGNESIUM HYDROXIDE 400 MG/5ML PO SUSP: 30.0000 mL | Freq: Every day | ORAL | Status: DC | PRN

## 2023-11-05 MED ORDER — PREDNISONE 5 MG PO TABS
20.0000 mg | ORAL_TABLET | Freq: Every day | ORAL | Status: AC
  Administered 2023-11-06 – 2023-11-09 (×4): 20 mg via ORAL
  Filled 2023-11-05 (×4): qty 4

## 2023-11-05 MED ORDER — ACETAMINOPHEN 325 MG PO TABS: 650.0000 mg | ORAL_TABLET | Freq: Four times a day (QID) | ORAL | Status: DC | PRN

## 2023-11-05 MED ORDER — DIVALPROEX SODIUM ER 500 MG PO TB24: 2000.0000 mg | ORAL_TABLET | Freq: Every day | ORAL | Status: DC

## 2023-11-05 MED ORDER — GABAPENTIN 300 MG PO CAPS
300.0000 mg | ORAL_CAPSULE | Freq: Two times a day (BID) | ORAL | Status: DC
  Filled 2023-11-05: qty 1

## 2023-11-05 MED ORDER — HALOPERIDOL LACTATE 5 MG/ML IJ SOLN
5.0000 mg | Freq: Three times a day (TID) | INTRAMUSCULAR | Status: DC | PRN
Start: 2023-11-05 — End: 2023-11-05

## 2023-11-05 MED ORDER — GABAPENTIN 300 MG PO CAPS: 300.0000 mg | ORAL_CAPSULE | Freq: Two times a day (BID) | ORAL | Status: DC

## 2023-11-05 MED ORDER — GABAPENTIN 300 MG PO CAPS
300.0000 mg | ORAL_CAPSULE | Freq: Three times a day (TID) | ORAL | Status: AC
  Administered 2023-11-06 – 2023-11-07 (×6): 300 mg via ORAL
  Filled 2023-11-05 (×5): qty 1

## 2023-11-05 MED ORDER — LORAZEPAM 2 MG/ML IJ SOLN
2.0000 mg | Freq: Three times a day (TID) | INTRAMUSCULAR | Status: DC | PRN
Start: 2023-11-05 — End: 2023-11-05

## 2023-11-05 MED ORDER — NICOTINE 14 MG/24HR TD PT24: 14.0000 mg | MEDICATED_PATCH | Freq: Every day | TRANSDERMAL | Status: DC | PRN

## 2023-11-05 MED ORDER — TRAZODONE HCL 100 MG PO TABS: 200.0000 mg | ORAL_TABLET | Freq: Every day | ORAL | Status: DC

## 2023-11-05 MED ORDER — NICOTINE 14 MG/24HR TD PT24
14.0000 mg | MEDICATED_PATCH | Freq: Every day | TRANSDERMAL | Status: DC | PRN
  Administered 2023-11-06: 14 mg via TRANSDERMAL
  Filled 2023-11-05 (×2): qty 1

## 2023-11-05 MED ORDER — DIPHENHYDRAMINE HCL 25 MG PO CAPS: 50.0000 mg | ORAL_CAPSULE | Freq: Three times a day (TID) | ORAL | Status: DC | PRN

## 2023-11-05 MED ORDER — HALOPERIDOL LACTATE 5 MG/ML IJ SOLN: 5.0000 mg | Freq: Three times a day (TID) | INTRAMUSCULAR | Status: DC | PRN

## 2023-11-05 MED ORDER — DOXYCYCLINE HYCLATE 100 MG PO TABS
100.0000 mg | ORAL_TABLET | Freq: Two times a day (BID) | ORAL | Status: DC
  Administered 2023-11-05: 100 mg via ORAL
  Filled 2023-11-05: qty 1

## 2023-11-05 MED ORDER — GABAPENTIN 300 MG PO CAPS
300.0000 mg | ORAL_CAPSULE | Freq: Three times a day (TID) | ORAL | Status: DC
  Administered 2023-11-05: 300 mg via ORAL
  Filled 2023-11-05: qty 1

## 2023-11-05 MED ORDER — DIVALPROEX SODIUM ER 500 MG PO TB24
2000.0000 mg | ORAL_TABLET | Freq: Every day | ORAL | Status: DC
  Administered 2023-11-05 – 2023-11-09 (×5): 2000 mg via ORAL
  Filled 2023-11-05 (×5): qty 4

## 2023-11-05 MED ORDER — PREDNISONE 20 MG PO TABS
20.0000 mg | ORAL_TABLET | Freq: Every day | ORAL | Status: DC
  Administered 2023-11-05: 20 mg via ORAL
  Filled 2023-11-05: qty 1

## 2023-11-05 NOTE — Discharge Instructions (Signed)
Accepted to [facility]

## 2023-11-05 NOTE — ED Notes (Signed)
 Patient's lab was sent off, two lavender tubes, one dark green tube, one light green tube, one yellow tube and one red tube. Along with the patient's Order Requisition and the patient's names on the tubes.
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
## 2023-11-05 NOTE — BH Assessment (Addendum)
 Comprehensive Clinical Assessment (CCA) Note  11/05/2023 Mark [name] [id number]  Disposition: Per Suzen Lesches, NP inpatient treatment is recommended.  [facility] to review.  Disposition SW to pursue appropriate inpatient options.  The patient demonstrates the following risk factors for suicide: Chronic risk factors for suicide include: psychiatric disorder of Bipolar, PTSD, IDD and demographic factors (male, >32 y/o). Acute risk factors for suicide include: family or marital conflict and loss (financial, interpersonal, professional). Protective factors for this patient include: positive social support, positive therapeutic relationship, responsibility to others (children, family), and hope for the future. Considering these factors, the overall suicide risk at this point appears to be moderate. Patient is appropriate for outpatient follow up, once stabilized.   Patient is a 33 y.o. male with a hx of Bipolar Disorder, PTSD and IDD who presents via BHRT after he was found climbing up to a bridge/overpass over 29.  Patient reports worsening depression and claims, I've been trying to get help and no one will help me.  Patient reprorts he is tired and I wish I had ended it earlier today.  Patient's admits to feeling overwhelmed this morning and states he wanted to end his life.  BHRT staff were contacted by a bystander who noticed patient climbing the structure.  BHRT located patient after he has climbed down from the bridge.  Patient stated he wanted to come in for evaluation, stating he is overwhelmed and tired of trying to find help.  I keep asking and no one wants to help me.  Patient denies any specific stressors or triggers to his worsening depression outside of this recent medication change.  Patient's identifies protective factors being engaged now to his fiance, whom he has known for 4 months.  Patient states he has been living with her for several months, as he was not happy in the boarding  house his legal guardian had secured for him.  He plans to be added to his fiance's lease soon.  Patient reports he is followed by [facility] behavioral health outpatient provider, whom he believes left the clinic.  Patient states this is why he hasn't followed up with his provider regarding his current med regimen.  He does not have the provider's name, however describes him as Asian.  He is not comfortable with changing providers, as he felt he had a good connection with this provider.  Per chart review, patient has been seeing Dr. Lynnette with [facility]. Patient reports he is compliant with medications, again discussing his concern that the Prozac  added in April could be causing his symptoms.  Patient denies HI, AVH.  He admits to vaping THC most days.  Treatment options were discussed with the patient, and he is in agreement with inpatient treatment recommendation.   Chief Complaint: No chief complaint on file.  Visit Diagnosis: Bipolar Disorder                             PTSD                             IDD   CCA Screening, Triage and Referral (STR)  Patient Reported Information How did you hear about us ? Legal System  What Is the Reason for Your Visit/Call Today? Patient is a 33 y.o. male with a hx of Bipolar Disorder, PTSD and IDD who presents via BHRT after he was found climbing up to a bridge/overpass over  29.  Patient reports worsening depression and claims, I've been trying to get help and no one will help me.  He reports he is compliant with medications, and is concerned the Prozac , added iin 06/2023, could be cousing worsening depression.  Patient reprorts he is tired and I wish I had ended it earlier today.  Patient denies HI and AVH. He reports he vapes THC regularly.  How Long Has This Been Causing You Problems? 1-6 months  What Do You Feel Would Help You the Most Today? Treatment for Depression or other mood problem   Have You Recently Had Any Thoughts About Hurting Yourself?  Yes  Are You Planning to Commit Suicide/Harm Yourself At This time? No   Flowsheet Row ED from 11/05/2023 in [facility] Most recent reading at 11/05/2023 12:19 PM ED from 11/02/2023 in [facility] Emergency Department at [facility] Most recent reading at 11/02/2023  2:34 PM ED from 11/02/2023 in [facility] Emergency Department at [facility] Most recent reading at 11/02/2023  8:08 AM  C-SSRS RISK CATEGORY High Risk No Risk No Risk    Have you Recently Had Thoughts About Hurting Someone Sherral? No  Are You Planning to Harm Someone at This Time? No  Explanation: N/A  Have You Used Any Alcohol or Drugs in the Past 24 Hours? No  How Long Ago Did You Use Drugs or Alcohol? N/A What Did You Use and How Much? N/A  Do You Currently Have a Therapist/Psychiatrist? Yes  Name of Therapist/Psychiatrist: Name of Therapist/Psychiatrist: [facility] for med management   Have You Been Recently Discharged From Any Office Practice or Programs? No  Explanation of Discharge From Practice/Program: N/A    CCA Screening Triage Referral Assessment Type of Contact: Face-to-Face  Telemedicine Service Delivery:   Is this Initial or Reassessment?   Date Telepsych consult ordered in [facility]:    Time Telepsych consult ordered in [facility]:    Location of Assessment: [facility] Assessment Services  Provider Location: [facility] Assessment Services   Collateral Involvement: None   Does Patient Have a Automotive engineer Guardian? Yes Other:  Legal Guardian Contact Information: Terrea Baron -[phone]  Copy of Legal Guardianship Form: Yes  Legal Guardian Notified of Arrival: Attempted notification unsuccessful  Legal Guardian Notified of Pending Discharge: Attempted notification unsuccessful  If Minor and Not Living with Parent(s), Who has Custody? N/A  Is CPS involved or ever been involved? Never  Is APS involved or ever been involved? Never   Patient  Determined To Be At Risk for Harm To Self or Others Based on Review of Patient Reported Information or Presenting Complaint? Yes, for Self-Harm  Method: -- (N/A, no HI)  Availability of Means: -- (N/A, no HI)  Intent: -- (N/A, no HI)  Notification Required: -- (N/A, no HI)  Additional Information for Danger to Others Potential: -- (N/A, no HI)  Additional Comments for Danger to Others Potential: N/A, no HI  Are There Guns or Other Weapons in Your Home? No  Types of Guns/Weapons: N/A  Are These Weapons Safely Secured?                            -- (N/A)  Who Could Verify You Are Able To Have These Secured: N/A  Do You Have any Outstanding Charges, Pending Court Dates, Parole/Probation? 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 No    [place] of Residence: [place]   Patient Currently Receiving the Following Services: Medication Management   Determination of Need: Urgent (48 hours)   Options For Referral: Inpatient Hospitalization     CCA Biopsychosocial Patient Reported Schizophrenia/Schizoaffective Diagnosis in Past: No   Strengths: Patient states is wanting help today.  He is compliant with medications and outpt tx.   Mental Health Symptoms Depression:  Change in energy/activity; Difficulty Concentrating; Hopelessness; Worthlessness   Duration of Depressive symptoms: Duration of Depressive Symptoms: Greater than two weeks   Mania:  None   Anxiety:   Worrying; Tension   Psychosis:  None   Duration of Psychotic symptoms:    Trauma:  None   Obsessions:  None   Compulsions:  None   Inattention:  N/A   Hyperactivity/Impulsivity:  N/A   Oppositional/Defiant Behaviors:  N/A   Emotional Irregularity:  None   Other Mood/Personality Symptoms:  Worsening depression with SI since 06/2023    Mental Status Exam Appearance and self-care  Stature:  Average    Weight:  Average weight   Clothing:  Casual   Grooming:  Normal   Cosmetic use:  None   Posture/gait:  Normal   Motor activity:  Not Remarkable   Sensorium  Attention:  Normal; Confused (mild confusion at times related to cognitive impairment)   Concentration:  Variable   Orientation:  Object; Person; Place; Time   Recall/memory:  Normal   Affect and Mood  Affect:  Depressed   Mood:  Depressed   Relating  Eye contact:  Normal   Facial expression:  Depressed; Responsive   Attitude toward examiner:  Cooperative   Thought and Language  Speech flow: Clear and Coherent   Thought content:  Appropriate to Mood and Circumstances   Preoccupation:  None   Hallucinations:  None   Organization:  Goal-directed   [employer] of Knowledge:  Impoverished by (Comment) (Cognitive Disability - IDD)   Intelligence:  Below average   Abstraction:  Functional   Judgement:  Impaired   Reality Testing:  Distorted   Insight:  Gaps   Decision Making:  Vacilates   Social Functioning  Social Maturity:  Impulsive   Social Judgement:  Naive   Stress  Stressors:  Relationship; Financial; Housing   Coping Ability:  Exhausted   Skill Deficits:  Decision making; Intellect/education; Responsibility   Supports:  Friends/Service system; Family     Religion: Religion/Spirituality Are You A Religious Person?: No How Might This Affect Treatment?: N/A  Leisure/Recreation: Leisure / Recreation Do You Have Hobbies?: No  Exercise/Diet: Exercise/Diet Do You Exercise?: No Have You Gained or Lost A Significant Amount of Weight in the Past Six Months?: No Do You Follow a Special Diet?: No Do You Have Any Trouble Sleeping?: No   CCA Employment/Education Employment/Work Situation: Employment / Work Situation Employment Situation: On disability Why is Patient on Disability: Mental Illness and IDD How Long has Patient Been on Disability: Unknown Patient's  Job has Been Impacted by Current Illness:  (N/A) Has Patient ever Been in the [employer]?: No  Education: Education Is Patient Currently Attending School?: No Last Grade Completed:  (NA) Did You Attend College?: No Did You Have An Individualized Education Program (IIEP): Yes Did You Have Any Difficulty At School?: Yes Were Any Medications Ever Prescribed For These Difficulties?:  (NA) Patient's Education Has Been Impacted by Current Illness: No   CCA Family/Childhood History Family and Relationship History: Family  history Marital status: Other (comment) (Engaged to fiance he has known 4 mos.) Does patient have children?: No  Childhood History:  Childhood History By whom was/is the patient raised?: Mother Did patient suffer any verbal/emotional/physical/sexual abuse as a child?: No Did patient suffer from severe childhood neglect?: No Has patient ever been sexually abused/assaulted/raped as an adolescent or adult?: No Was the patient ever a victim of a crime or a disaster?: No Witnessed domestic violence?: No Has patient been affected by domestic violence as an adult?: No       CCA Substance Use Alcohol/Drug Use: Alcohol / Drug Use Pain Medications: See MAR Prescriptions: See MAR Over the Counter: See MAR History of alcohol / drug use?: No history of alcohol / drug abuse Longest period of sobriety (when/how long): Patient admits to regular THC vaping, later states he vapes nicotine , unclear at this point.                         ASAM's:  Six Dimensions of Multidimensional Assessment  Dimension 1:  Acute Intoxication and/or Withdrawal Potential:      Dimension 2:  Biomedical Conditions and Complications:      Dimension 3:  Emotional, Behavioral, or Cognitive Conditions and Complications:     Dimension 4:  Readiness to Change:     Dimension 5:  Relapse, Continued use, or Continued Problem Potential:     Dimension 6:  Recovery/Living Environment:     ASAM  Severity Score:    ASAM Recommended Level of Treatment:     Substance use Disorder (SUD)    Recommendations for Services/Supports/Treatments:    Disposition Recommendation per psychiatric provider: We recommend inpatient psychiatric hospitalization when medically cleared. Patient is under voluntary admission status at this time; please IVC if attempts to leave hospital.   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

## 2023-11-05 NOTE — Progress Notes (Signed)
 Pt is presently resting quietly. No distress noted or concerns voiced. Staff will monitor for pt's safety.

## 2023-11-05 NOTE — Progress Notes (Addendum)
 Pt is admitted to [facility] due to SI with plan to jump off a bridge. Pt currently denies and verbally contracts for safety on the unit. Pt was advised to notify staff when having intrusive thoughts of hurting self or others. Pt verbalized understanding. Pt is alert and oriented X4. Pt ambulates with a walker due chronic right foot pain. Pt was oriented to staff/unit. Pt was cooperative with labs and skin assessment. Abrasion was noted on pt's right knee, ankle and foot. Pt reported hx of seizures with first episode on 8/22. Pt denies current HI/AVH. Staff will monitor for pt's safety.

## 2023-11-05 NOTE — Tx Team (Signed)
 Initial Treatment Plan 11/05/2023 10:58 PM Mark [name] FMW:[id number]    PATIENT STRESSORS: Health problems   Other: Medication ineffeicency      PATIENT STRENGTHS: Motivation for treatment/growth  Supportive family/friends  Work skills    PATIENT IDENTIFIED PROBLEMS: New onsets of seizures  Anxiety medication not working                   DISCHARGE CRITERIA:  Improved stabilization in mood, thinking, and/or behavior Motivation to continue treatment in a less acute level of care Reduction of life-threatening or endangering symptoms to within safe limits Verbal commitment to aftercare and medication compliance  PRELIMINARY DISCHARGE PLAN: Return to previous living arrangement Return to previous work or school arrangements  PATIENT/FAMILY INVOLVEMENT: This treatment plan has been presented to and reviewed with the patient, Mark [name].  The patient has been given the opportunity to ask questions and make suggestions.  Lunabella Badgett, RN 11/05/2023, 10:58 PM

## 2023-11-05 NOTE — ED Provider Notes (Signed)
 Behavioral Health Urgent Care Medical Screening Exam  Patient Name: Mark [name] MRN: [id number] Date of Evaluation: 11/05/23 Chief Complaint:  I've been trying to get help for awhile Diagnosis:  Final diagnoses:  Severe episode of recurrent major depressive disorder, without psychotic features (HCC)  Suicidal behavior without attempted self-injury  Neuropathic ulcer of right foot, limited to breakdown of skin (HCC)  Cellulitis of foot, right   History of Present illness: Mark [name] is a 33 y.o. male, with history of PTSD, MDD, Impulse Control Disorder, IDD, presents today to [facility] accompanied by law enforcement after being video taped by bystander climbing on top of a covered walking bridge and concern that patient was going to jump. This Clinical research associate spoke with officers who had a video.  The patient climbing up on a tall overpass with heavy traffic following [place] [address].  Per officer when they arrived to pick patient up he had climbed down from the overpass and was sitting down beside the bridge.  Patient is here currently voluntarily and admits that he is actively suicidal as he feels several things are not going right in his life and he is unable to get the mental health treatment and get the correct medications prescribed to help his overall depression and anxiety.  Patient has a DSS Payee and guardian: Mark [name] (SWPS) Office: [phone], Cell: [phone]  Patient reports that he was previously living in a boarding home but for last few months he is living with his fiance'. He reports he and his fiance have been experiencing discord which he attribute mostly to his mental health. Patient is fearful that his girlfriend may leave him.  Patient reports since April of this year his overall mental health has been decompensating.  He reports being followed by [name] outpatient [facility] and previously was last seen by [facility] [name]. Patient reports  back in April being prescribed fluoxetine  and has noticed since that time he has had increased frequency of irritability, increased anxiety, frequent mood swings, and feeling increasingly jittery.  Patient endorses active suicidality due to feelings of hopelessness, depressed mood and concerns that he will be abandoned and left alone by his girlfriend.  Patient denies any illicit substance use although endorses that he purchases THC substances and flavored vape products  from a local vape stores, and endorses daily cigarette use. Denies ETOH.   During evaluation Mark [name] is sitting upright  in no acute distress.  He is alert, oriented x 4, calm, cooperative and attentive.  His mood is depressed and hopeless with congruent affect.  He has normal speech, and behavior.  Objectively there is no evidence of psychosis/mania or delusional thinking.  Patient is able to converse coherently, goal directed thoughts, no distractibility, or pre-occupation.  Patient endorses  suicidal thoughts with a plan to jump from bridge and was observed climbing a bridge earlier today. Denies homicidal ideations or desire to harm others.  Patient is unable to contract for safety and meets inpatient criteria for inpatient psychiatric treatment.  If patient attempts to leave he will be IVC.   Right Foot Swelling and Ulcer Per chart review patient seen in the emergency department on 10/27/2023 treated with doxycycline  and prednisone  for suspected gout related to foot swelling and foot ulcer.  Patient completed all medication however on evaluation here at [facility] this writer noted that the patient has increased swelling and redness on the lateral aspect of the right foot.  Denies any new injury.  Reviewed chart and recent x-rays of right foot and right ankle shows diffuse swelling but no new fracture.  Patient has a distant history of a severe fracture which occurred several years back and he has had multiple surgeries on the  right foot and also has had some recurrent bouts of cellulitis involving the right foot. Will extend Doxycyline x 7 days and restart prednisone  low dose 20 mg daily for 5 days.   Collateral:  Contacted Legal Guardian: pending return phone call.  Flowsheet Row ED from 11/05/2023 in [facility] Most recent reading at 11/05/2023 12:51 PM ED from 11/02/2023 in [facility] Emergency Department at [facility] Most recent reading at 11/02/2023  2:34 PM ED from 11/02/2023 in [facility] Emergency Department at [facility] Most recent reading at 11/02/2023  8:08 AM  C-SSRS RISK CATEGORY High Risk No Risk No Risk    Psychiatric Specialty Exam  Presentation  General Appearance:Appropriate for Environment  Eye Contact:Good  Speech:Clear and Coherent  Speech Volume:Normal  Handedness:-- (Did not assess)   Mood and Affect  Mood:Depressed; Hopeless  Affect:Congruent   Thought Process  Thought Processes:Coherent  Descriptions of Associations:Intact  Orientation:Full (Time, Place and Person)  Thought Content:WDL    Hallucinations:None  Ideas of Reference:None  Suicidal Thoughts:Yes, Active With Intent; With Plan  Homicidal Thoughts:No   Sensorium  Memory:Immediate Good; Recent Good; Remote Good  Judgment:Poor  Insight:Present   Executive Functions  Concentration:Fair  Attention Span:Fair  Recall:Fair  Fund of Knowledge:Fair  Language:Fair   Psychomotor Activity  Psychomotor Activity:Normal   Assets  Assets:Physical Health; Desire for Improvement; Financial Resources/Insurance; Resilience; Housing; Communication Skills   Sleep  Sleep:Good (As long as he take precribed Trazodone )  Number of hours: No data recorded  Physical Exam: Physical Exam Constitutional:      General: He is not in acute distress.    Appearance: Normal appearance. He is not ill-appearing.  HENT:     Head: Normocephalic and atraumatic.      Nose: Nose normal.  Eyes:     Extraocular Movements: Extraocular movements intact.     Conjunctiva/sclera: Conjunctivae normal.     Pupils: Pupils are equal, round, and reactive to light.  Cardiovascular:     Rate and Rhythm: Normal rate and regular rhythm.  Pulmonary:     Effort: Pulmonary effort is normal.     Breath sounds: Normal breath sounds.  Musculoskeletal:     Cervical back: Normal range of motion and neck supple.  Skin:    General: Skin is warm and dry.  Neurological:     General: No focal deficit present.     Mental Status: He is alert and oriented to person, place, and time.     Review of Systems  Psychiatric/Behavioral:  Positive for depression, substance abuse and suicidal ideas. Negative for hallucinations and memory loss. The patient is nervous/anxious. The patient does not have insomnia.    Blood pressure 120/73, pulse 82, temperature 98.5 F (36.9 C), temperature source Oral, resp. rate 18, SpO2 96%. There is no height or weight on file to calculate BMI.  Musculoskeletal: Strength & Muscle Tone: within normal limits Gait & Station: normal Patient leans: N/A   [facility] MSE Discharge Disposition for Follow up and Recommendations: Based on my evaluation I certify that psychiatric inpatient services furnished can reasonably be expected to improve the patient's condition which I recommend transfer to an appropriate accepting facility.   Severe episode of recurrent major depressive disorder, without psychotic  features (HCC) Suicidal behavior without attempted self-injury -Hold fluoxetine  at this time -Continue Depakote  2000 mg at bedtime, obtaining a valproic acid  level,  -Defer to inpatient treatment team as to further medication management given patient's reports of worsening mood with fluoxetine .  Neuropathic ulcer of right foot, limited to breakdown of skin (HCC) Cellulitis of foot, right,  -Continue home Gabapentin  300 mg TID -Start doxycycline  100 mg twice  daily for 7 days for foot infection - Start low-dose prednisone  20 mg daily for 5 days for foot swelling and inflammation - Reviewed recent images of right foot there is no fracture and no evidence of osteomyelitis per recent   review right foot images obtained on 10/27/2023     Admission Labs: TSH, due to worsening depression and rule out hypothyroidism Lipid, standard lab ordered for all patients received an antipsychotic therapy Ethanol, standard order for all patient reporting alcohol misuse or overuse, or to rule out intoxication in patient  Hemoglobin A1c, standard lab ordered for all patients received an antipsychotic therapy, documented history of Type 2 Diabetes, no current medications therapy at this time CBC, rule out any infection or anemia CMP, rule out metabolic conditions which may be contributing to mental health symptoms UDS, standard order, rule out substance induced mood vs altered mental status related to substance use   Suzen Lesches, NP 11/05/2023, 1:06 PM

## 2023-11-05 NOTE — Progress Notes (Signed)
 Pt has been accepted to [facility] on 11/05/2023 . Bed assignment:401-1  Pt meets inpatient criteria per Suzen Lesches, NP   Attending Physician will be Dr. Starleen Kitty   Report can be called to: - Adult unit: [phone]  Pt can arrive next shift   Care Team Notified: [facility] Burnard Barter, RN, [name], RN, Suzen Lesches, NP

## 2023-11-06 DIAGNOSIS — F332 Major depressive disorder, recurrent severe without psychotic features: Secondary | ICD-10-CM

## 2023-11-06 LAB — LIPID PANEL
Cholesterol: 185 mg/dL (ref 0–200)
HDL: 40 mg/dL — ABNORMAL LOW (ref >40)
LDL Cholesterol: 115 mg/dL — ABNORMAL HIGH (ref 0–99)
Total CHOL/HDL Ratio: 4.6 ratio
Triglycerides: 148 mg/dL (ref <150)
VLDL: 30 mg/dL (ref 0–40)

## 2023-11-06 LAB — CBC WITH DIFFERENTIAL/PLATELET
Abs Immature Granulocytes: 0.16 K/uL — ABNORMAL HIGH (ref 0.00–0.07)
Basophils Absolute: 0.1 K/uL (ref 0.0–0.1)
Basophils Relative: 1 %
Eosinophils Absolute: 0.3 K/uL (ref 0.0–0.5)
Eosinophils Relative: 2 %
HCT: 43.3 % (ref 39.0–52.0)
Hemoglobin: 13.8 g/dL (ref 13.0–17.0)
Immature Granulocytes: 1 %
Lymphocytes Relative: 18 %
Lymphs Abs: 3.2 K/uL (ref 0.7–4.0)
MCH: 29.1 pg (ref 26.0–34.0)
MCHC: 31.9 g/dL (ref 30.0–36.0)
MCV: 91.2 fL (ref 80.0–100.0)
Monocytes Absolute: 1.9 K/uL — ABNORMAL HIGH (ref 0.1–1.0)
Monocytes Relative: 11 %
Neutro Abs: 12.2 K/uL — ABNORMAL HIGH (ref 1.7–7.7)
Neutrophils Relative %: 67 %
Platelets: 271 K/uL (ref 150–400)
RBC: 4.75 MIL/uL (ref 4.22–5.81)
RDW: 12.3 % (ref 11.5–15.5)
WBC: 17.8 K/uL — ABNORMAL HIGH (ref 4.0–10.5)
nRBC: 0 % (ref 0.0–0.2)

## 2023-11-06 LAB — FOLATE: Folate: 10.6 ng/mL (ref >5.9)

## 2023-11-06 LAB — VITAMIN D 25 HYDROXY (VIT D DEFICIENCY, FRACTURES): Vit D, 25-Hydroxy: 38.09 ng/mL (ref 30–100)

## 2023-11-06 LAB — VITAMIN B12: Vitamin B-12: 286 pg/mL (ref 180–914)

## 2023-11-06 LAB — HIV ANTIBODY (ROUTINE TESTING W REFLEX): HIV Screen 4th Generation wRfx: NONREACTIVE

## 2023-11-06 MED ORDER — LURASIDONE HCL 20 MG PO TABS
20.0000 mg | ORAL_TABLET | Freq: Every day | ORAL | Status: DC
  Administered 2023-11-06 – 2023-11-09 (×4): 20 mg via ORAL
  Filled 2023-11-06 (×4): qty 1

## 2023-11-06 MED ORDER — LURASIDONE HCL 20 MG PO TABS: 20.0000 mg | ORAL_TABLET | Freq: Every day | ORAL | Status: DC

## 2023-11-06 NOTE — [facility] Suicide Risk Assessment (Signed)
 Suicide Risk Assessment  Admission Assessment    [facility] Admission Suicide Risk Assessment   Nursing information obtained from:  Patient Demographic factors:  Male, Caucasian, Low socioeconomic status Current Mental Status:  NA Loss Factors:  Decline in physical health, Financial problems / change in socioeconomic status Historical Factors:  Prior suicide attempts, Impulsivity Risk Reduction Factors:  Positive social support, Employed, Sense of responsibility to family  Total Time spent with patient: 45 minutes  Principal Problem: MDD (major depressive disorder), recurrent episode, severe (HCC) Diagnosis:  Principal Problem:   MDD (major depressive disorder), recurrent episode, severe (HCC)  Subjective Data:  Mark [name] is a 33 year old Caucasian male with prior psychiatric history significant for IDD, Bipolar disorder, ADHD, PTSD, Impulse control, Cannabis use, and MDD.  Patient presents voluntarily to [facility] from [facility] for worsening depression resulting in suicidal ideation with intentions to jump from over a bridge however, did not do it, in the context of psychosocial stressors and poor psychotropic medication management.  BAL less than 15, UDS positive for marijuana.  While  Continued Clinical Symptoms:  Alcohol Use Disorder Identification Test Final Score (AUDIT): 0 The Alcohol Use Disorders Identification Test, Guidelines for Use in Primary Care, Second Edition.  World Science writer [facility]). Score between 0-7:  no or low risk or alcohol related problems. Score between 8-15:  moderate risk of alcohol related problems. Score between 16-19:  high risk of alcohol related problems. Score 20 or above:  warrants further diagnostic evaluation for alcohol dependence and treatment.  CLINICAL FACTORS:   Bipolar Disorder:   Mixed State Depression:   Anhedonia Impulsivity Severe Alcohol/Substance Abuse/Dependencies More than one psychiatric diagnosis Previous Psychiatric  Diagnoses and Treatments Medical Diagnoses and Treatments/Surgeries  Musculoskeletal: Strength & Muscle Tone: within normal limits Gait & Station: normal Patient leans: N/A  Psychiatric Specialty Exam:  Presentation  General Appearance:  Appropriate for Environment  Eye Contact: Good  Speech: Clear and Coherent  Speech Volume: Normal  Handedness: -- (Did not assess)  Mood and Affect  Mood: Depressed; Hopeless  Affect: Congruent  Thought Process  Thought Processes: Coherent  Descriptions of Associations:Intact  Orientation:Full (Time, Place and Person)  Thought Content:WDL  History of Schizophrenia/Schizoaffective disorder:No  Duration of Psychotic Symptoms:No data recorded Hallucinations:Hallucinations: None  Ideas of Reference:None  Suicidal Thoughts:Suicidal Thoughts: Yes, Active SI Active Intent and/or Plan: With Intent; With Plan  Homicidal Thoughts:Homicidal Thoughts: No  Sensorium  Memory: Immediate Good; Recent Good; Remote Good  Judgment: Poor  Insight: Present  Executive Functions  Concentration: Fair  Attention Span: Fair  Recall: [employer] of Knowledge: Fair  Language: Fair  Psychomotor Activity  Psychomotor Activity: Psychomotor Activity: Normal  Assets  Assets: Physical Health; Desire for Improvement; Financial Resources/Insurance; Resilience; Housing; Communication Skills  Sleep  Sleep: Sleep: Good (As long as he take precribed Trazodone )  Physical Exam: Physical Exam Vitals and nursing note reviewed.  Constitutional:      General: He is not in acute distress.    Appearance: He is normal weight. He is not ill-appearing.  HENT:     Head: Normocephalic.     Right Ear: External ear normal.     Left Ear: External ear normal.     Nose: Nose normal.     Mouth/Throat:     Mouth: Mucous membranes are moist.     Pharynx: Oropharynx is clear.  Eyes:     Extraocular Movements: Extraocular movements intact.   Cardiovascular:     Rate and  Rhythm: Normal rate.     Pulses: Normal pulses.  Pulmonary:     Effort: Pulmonary effort is normal. No respiratory distress.  Abdominal:     Comments: Deferred  Genitourinary:    Comments: Deferred Musculoskeletal:        General: Normal range of motion.     Cervical back: Normal range of motion.  Skin:    General: Skin is warm.  Neurological:     General: No focal deficit present.     Mental Status: He is alert and oriented to person, place, and time.  Psychiatric:        Mood and Affect: Mood normal.        Behavior: Behavior normal.    Review of Systems  Constitutional:  Negative for chills and fever.  HENT:  Negative for sore throat.   Eyes:  Negative for blurred vision.  Respiratory:  Negative for cough, sputum production, shortness of breath and wheezing.   Cardiovascular:  Negative for chest pain and palpitations.  Gastrointestinal:  Negative for abdominal pain, constipation, diarrhea, heartburn, nausea and vomiting.  Genitourinary:  Negative for dysuria.  Musculoskeletal:  Negative for falls.  Skin:  Negative for itching and rash.  Neurological:  Negative for dizziness and headaches.  Endo/Heme/Allergies:        See allergy listing  Psychiatric/Behavioral:  Positive for depression. Negative for hallucinations, substance abuse and suicidal ideas. The patient is nervous/anxious. The patient does not have insomnia.    Blood pressure 116/67, pulse 84, temperature 98 F (36.7 C), temperature source Oral, resp. rate 16, height 6' 2 (1.88 m), weight 87.1 kg, SpO2 98%. Body mass index is 24.65 kg/m.  COGNITIVE FEATURES THAT CONTRIBUTE TO RISK:  Polarized thinking    SUICIDE RISK:   Moderate:  Frequent suicidal ideation with limited intensity, and duration, some specificity in terms of plans, no associated intent, good self-control, limited dysphoria/symptomatology, some risk factors present, and identifiable protective factors, including  available and accessible social support.  PLAN OF CARE: Treatment Plan Summary: Daily contact with patient to assess and evaluate symptoms and progress in treatment and Medication management  Observation Level/Precautions:  15 minute checks  Labs:  CBC: WBC 17.8, neutrophils 2.2, monocyte 1.9, 6 absolute immature granulocytes 0.16.  Otherwise normal CMP: Glucose 103, albumin 3.1, total protein 6.2.  Otherwise normal UDS: Positive for marijuana BAL: Less than 15 Hemoglobin A1c: 5.2 TSH: 0.691, normal Lipid panel: Ordered Vitamin B12: Ordered Vitamin D  25-hydroxy: Ordered HIV: Antibodies/HIV 4 GL safe tube daily: Ordered RPR: Ordered Folate: Ordered:  Medications: See MAR  Consultations: Pending  Discharge Concerns: Safety  Estimated LOS: 3 to 7 days  Other:     Assessment: Mark [name] is a 33 year old Caucasian male with prior psychiatric history significant for IDD, Bipolar disorder, ADHD, PTSD, Impulse control, Cannabis use, and MDD.  Patient presents voluntarily to [facility] from [facility] for worsening depression resulting in suicidal ideation with intentions to jump from over a bridge however, did not do it, in the context of psychosocial stressors and poor psychotropic medication management.  BAL less than 15, UDS positive for marijuana.  Physician Treatment Plan for Primary Diagnosis: MDD (major depressive disorder), recurrent episode, severe (HCC)  Plans: Medications: --Initiate Latuda  20 mg p.o. q. nightly for mood stability --Continue Depakote  2000 mg p.o. nightly for mood stabilization --Continue gabapentin  300 mg p.o. 3 times daily for GAD -- Continue trazodone  50 mg p.o. q. nightly as needed for insomnia -- Continue hydroxyzine   25 mg p.o. 3 times daily as needed for anxiety  Medication for other medical conditions: --Continue doxycycline  100 mg p.o. twice daily x 13 doses for right foot infection --Continue nicotine  21 mg patch transdermally over 24 hours for  smoking cessation -- Continue prednisone  20 mg p.o. daily with breakfast x 4 doses while.  Other PRN Medications  --Acetaminophen  650 mg every 6 as needed/mild pain  -Maalox 30 mL oral every 4 as needed/digestion  -Magnesium  hydroxide 30 mL daily as needed/mild constipation   --The risks/benefits/side-effects/alternatives to this medication were discussed in detail with the patient and time was given for questions. The patient consents to medication trial.   -- Metabolic profile and EKG monitoring obtained while on an atypical antipsychotic (BMI: Lipid Panel: HbgA1c: QTc:)   -- Encouraged patient to participate in unit milieu and in scheduled group therapies   Safety and Monitoring:  Voluntary admission to inpatient psychiatric unit for safety, stabilization and treatment  Daily contact with patient to assess and evaluate symptoms and progress in treatment  Patient's case to be discussed in multi-disciplinary team meeting  Observation Level : q15 minute checks  Vital signs: q12 hours  Precautions: suicide, but pt currently verbally contracts for safety on unit?   Discharge Planning:  Social work and case management to assist with discharge planning and identification of hospital follow-up needs prior to discharge  Estimated LOS: 5-7?days  Discharge Concerns: Need to establisDoes effecxor cause dizziness?  Safety plan; Medication compliance and effectiveness  Discharge Goals: Return home with outpatient referrals for mental health follow-up including medication management/psychotherapy.   Long Term Goal(s): Improvement in symptoms so as ready for discharge  Short Term Goals: Ability to identify changes in lifestyle to reduce recurrence of condition will improve, Ability to verbalize feelings will improve, Ability to disclose and discuss suicidal ideas, Ability to demonstrate self-control will improve, Ability to identify and develop effective coping behaviors will improve, Ability to  maintain clinical measurements within normal limits will improve, Compliance with prescribed medications will improve, and Ability to identify triggers associated with substance abuse/mental health issues will improve  Physician Treatment Plan for Secondary Diagnosis: Principal Problem:   MDD (major depressive disorder), recurrent episode, severe (HCC)  I certify that inpatient services furnished can reasonably be expected to improve the patient's condition.     Ellouise JAYSON Azure, FNP 11/06/2023, 9:56 AM

## 2023-11-06 NOTE — Group Note (Signed)
 Recreation Therapy Group Note   Group Topic:Animal Assisted Therapy   Group Date: 11/06/2023 Start Time: 0945 End Time: 1030 Facilitators: Ximena Todaro-McCall, LRT,CTRS Location: [address] Dayroom   Animal-Assisted Activity (AAA) Program Checklist/Progress Notes Patient Eligibility Criteria Checklist & Daily Group note for Rec Tx Intervention  AAA/T Program Assumption of Risk Form signed by Patient/ or Parent Legal Guardian Yes  Patient is free of allergies or severe asthma Yes  Patient reports no fear of animals Yes  Patient reports no history of cruelty to animals Yes  Patient understands his/her participation is voluntary Yes  Patient washes hands before animal contact Yes  Patient washes hands after animal contact Yes  Behavioral Response: Engaged   Education: Charity fundraiser, Appropriate Animal Interaction   Education Outcome: Acknowledges education.    Affect/Mood: Appropriate   Participation Level: Engaged   Participation Quality: Independent   Behavior: Appropriate   Speech/Thought Process: Focused   Insight: Good   Judgement: Good   Modes of Intervention: Teaching laboratory technician   Patient Response to Interventions:  Engaged   Education Outcome:  In group clarification offered    Clinical Observations/Individualized Feedback: Patient attended session and interacted appropriately with therapy dog and peers. Patient asked appropriate questions about therapy dog and his training. Patient shared stories about their pets at home with group.   Plan: Continue to engage patient in RT group sessions 2-3x/week.   Allie Ousley-McCall, LRT,CTRS  11/06/2023 12:37 PM

## 2023-11-06 NOTE — [facility] Suicide Risk Assessment (Signed)
 [facility] INPATIENT:  Family/Significant Other Suicide Prevention Education  Suicide Prevention Education:  Education Completed; Mark [name] (significant other) [phone],  (name of family member/significant other) has been identified by the patient as the family member/significant other with whom the patient will be residing, and identified as the person(s) who will aid the patient in the event of a mental health crisis (suicidal ideations/suicide attempt).  With written consent from the patient, the family member/significant other has been provided the following suicide prevention education, prior to the and/or following the discharge of the patient.  The suicide prevention education provided includes the following: Suicide risk factors Suicide prevention and interventions National Suicide Hotline telephone number [facility]: [place] Rhonda W assessment telephone number [facility] Emergency Assistance 911 [facility] and/or Residential Mobile Crisis Unit telephone number  Request made of family/significant other to: Remove weapons (e.g., guns, rifles, knives), all items previously/currently identified as safety concern.   Remove drugs/medications (over-the-counter, prescriptions, illicit drugs), all items previously/currently identified as a safety concern.  Mark [name], patient's significant other, denies presence of weapons in the home and agrees to remove patient's access to sharp objects such as knives. Mark also agreed to safely store OTC/prescription drugs away from patient access. Patient is allowed to return to significant other's home upon discharge.   The family member/significant other verbalizes understanding of the suicide prevention education information provided.  The family member/significant other agrees to remove the items of safety concern listed above.  Mark [name] 11/06/2023, 2:54 PM
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
## 2023-11-06 NOTE — Progress Notes (Signed)
 CSW was successful in contacting patient's legal guardian, Mark [name] [phone]. Guardian stated [name] has up and down behaviors and explosive moods. He gets into romantic relationships often and splits up with them soon after. His current girlfriend is his fourth girlfriend this year and he lost his job due to the last girlfriend he was with. Guardian has been his Child psychotherapist and payee through [facility] DSS since January 2025. Guardian provided verbal consent for ROIs with witness [name] (LCSW-A).     [name], LCSW-A 11/06/2023

## 2023-11-06 NOTE — Progress Notes (Signed)
 D: Patient is alert, oriented, pleasant, and cooperative. Denies SI, HI, AVH, and verbally contracts for safety. Patient reports foot pain.    A: Scheduled medications administered per MD order. PRN tylenol  administered. Support provided. Patient educated on safety on the unit and medications. Routine safety checks every 15 minutes. Patient stated understanding to tell nurse about any new physical symptoms. Patient understands to tell staff of any needs.     R: No adverse drug reactions noted. Patient remains safe at this time and will continue to monitor.    11/06/23 1200  Psych Admission Type (Psych Patients Only)  Admission Status Voluntary  Psychosocial Assessment  Patient Complaints Anxiety;Depression  Eye Contact Fair  Facial Expression Animated  Affect Anxious;Depressed  Speech Logical/coherent  Interaction Assertive  Motor Activity Slow  Appearance/Hygiene Unremarkable  Behavior Characteristics Cooperative;Appropriate to situation  Mood Depressed;Anxious  Thought Process  Coherency WDL  Content WDL  Delusions None reported or observed  Perception WDL  Hallucination None reported or observed  Judgment Poor  Confusion None  Danger to Self  Current suicidal ideation? Denies  Self-Injurious Behavior No self-injurious ideation or behavior indicators observed or expressed   Agreement Not to Harm Self Yes  Description of Agreement verbal  Danger to Others  Danger to Others None reported or observed

## 2023-11-06 NOTE — Progress Notes (Signed)
 Shift Note  (Sleep Hours) - 7   (Any PRNs that were needed, meds refused, or side effects to meds)- PO PRN Trazodone    (Any disturbances and when (visitation, over night)- None  (Concerns raised by the patient)- None  (SI/HI/AVH)- Denies

## 2023-11-06 NOTE — Group Note (Signed)
 Date:  11/06/2023 Time:  3:50 PM  Group Topic/Focus:  Overcoming Stress:   The focus of this group is to define stress and help patients assess their triggers. Self Care:   The focus of this group is to help patients understand the importance of self-care in order to improve or restore emotional, physical, spiritual, interpersonal, and financial health.    Participation Level:  Active  Participation Quality:  Appropriate and Intrusive  Affect:  Anxious  Cognitive:  Appropriate  Insight: Lacking  Engagement in Group:  Monopolizing  Modes of Intervention:  Activity   Mark [name] 11/06/2023, 3:50 PM

## 2023-11-06 NOTE — Group Note (Signed)
 LCSW Group Therapy Note   Group Date: 11/06/2023 Start Time: 1100 End Time: 1200   Participation:  patient was present and actively participated in the discussion.  Type of Therapy:  Group Therapy  Topic:  Understanding Your Path to Change  Objective:  The goal is to help individuals understand the stages of change, identify where they currently are in the process, and provide actionable next steps to continue moving forward in their journey of change.  Goals: Learn about the six stages of change:  Precontemplation, Contemplation, Preparation, Action, Maintenance, and Relapse Reflect on Current Change Efforts:  Recognize which stage participants are in regarding a personal change. Plan Next Steps for Moving Forward:  Create an action plan based on their current stage of change.  Group Summary:  In this session, we explored the Stages of Change as a framework to understand the process of change.  We discussed how each stage helps individuals recognize where they are in their personal journey and used the Stages of Change Worksheet for self-reflection. Participants answered questions to better understand their current stage, challenges, and progress. We also emphasized the importance of moving forward, even if setbacks (Relapse) occur, and created actionable steps to help participants continue progressing. By the end of the session, participants gained a clearer understanding of their path to change and left with a clear plan for next steps.  Therapeutic Modalities:  Elements of CBT (cognitive restructuring, problem solving)  Element of DBT (mindfulness, distress tolerance)   Mark [name], LCSWA 11/06/2023  5:11 PM

## 2023-11-06 NOTE — [facility] Group Notes (Signed)
 [facility] Group Notes:  (Nursing/MHT/Case Management/Adjunct)  Date:  11/06/2023  Time:  8:54 PM  Type of Therapy:  Wrap-up group  Participation Level:  Active  Participation Quality:  Appropriate  Affect:  Appropriate  Cognitive:  Appropriate  Insight:  Appropriate  Engagement in Group:  Engaged  Modes of Intervention:  Education  Summary of Progress/Problems: Goal to find resources to help with med management. Rated day 8/10.  Mark [name] 11/06/2023, 8:54 PM

## 2023-11-06 NOTE — Plan of Care (Signed)
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
## 2023-11-06 NOTE — Group Note (Signed)
 Date:  11/06/2023 Time:  9:56 AM  Group Topic/Focus:  Goals Group:   The focus of this group is to help patients establish daily goals to achieve during treatment and discuss how the patient can incorporate goal setting into their daily lives to aide in recovery.    Participation Level:  Active  Participation Quality:  Appropriate  Affect:  Appropriate  Cognitive:  Appropriate  Insight: Appropriate  Engagement in Group:  Engaged  Modes of Intervention:  Discussion  Additional Comments:  pt wants to get meds under control  Nat Rummer 11/06/2023, 9:56 AM

## 2023-11-07 ENCOUNTER — Encounter (HOSPITAL_COMMUNITY): Payer: Self-pay

## 2023-11-07 DIAGNOSIS — F3160 Bipolar disorder, current episode mixed, unspecified: Principal | ICD-10-CM | POA: Insufficient documentation

## 2023-11-07 LAB — SYPHILIS: RPR W/REFLEX TO RPR TITER AND TREPONEMAL ANTIBODIES, TRADITIONAL SCREENING AND DIAGNOSIS ALGORITHM: RPR Ser Ql: NONREACTIVE

## 2023-11-07 NOTE — B[facility] Group Notes (Signed)
 Spiritual care group facilitated by Chaplain Rockie Sofia, [facility]  Group focused on topic of strength. Group members reflected on what thoughts and feelings emerge when they hear this topic. They then engaged in facilitated dialog around how strength is present in their lives. This dialog focused on representing what strength had been to them in their lives (images and patterns given) and what they saw as helpful in their life now (what they needed / wanted).  Activity drew on narrative framework.  Patient Progress: Mark [name] attended group and actively engaged and participated in group conversation and activities.

## 2023-11-07 NOTE — Progress Notes (Signed)
   11/07/23 1033  Psychosocial Assessment  Patient Complaints None  Eye Contact Fair  Facial Expression Animated  Affect Anxious  Speech Logical/coherent  Interaction Assertive  Motor Activity Slow  Appearance/Hygiene Disheveled;In scrubs  Behavior Characteristics Cooperative;Appropriate to situation  Mood Pleasant  Thought Process  Coherency WDL  Content WDL  Delusions None reported or observed  Perception WDL  Hallucination None reported or observed  Judgment Poor  Confusion None  Danger to Self  Current suicidal ideation? Denies  Danger to Others  Danger to Others None reported or observed

## 2023-11-07 NOTE — Group Note (Signed)
 Date:  11/07/2023 Time:  9:37 AM  Group Topic/Focus:  Goals Group:   The focus of this group is to help patients establish daily goals to achieve during treatment and discuss how the patient can incorporate goal setting into their daily lives to aide in recovery.    Participation Level:  Did Not Attend  Participation Quality:  n/a  Affect:  n/a  Cognitive:  n/a  Insight: None  Engagement in Group:  n/a  Modes of Intervention:  n/a  Additional Comments:  n/a  Nat Rummer 11/07/2023, 9:37 AM

## 2023-11-07 NOTE — Progress Notes (Signed)
(  Sleep Hours) - 7.75 (Any PRNs that were needed, meds refused, or side effects to meds)- PRN tylenol  650 mg given at pt request, no meds denied.  (Any disturbances and when (visitation, over night)- None  (Concerns raised by the patient)- None  (SI/HI/AVH)- Denies SI/HI/AVH

## 2023-11-07 NOTE — [facility] Group Notes (Signed)
 [facility] Group Notes:  (Nursing/MHT/Case Management/Adjunct)  Date:  11/07/2023  Time:  10:11 PM  Type of Therapy:  Wrap-up group  Participation Level:  Active  Participation Quality:  Appropriate  Affect:  Appropriate  Cognitive:  Appropriate  Insight:  Appropriate  Engagement in Group:  Engaged  Modes of Intervention:  Education  Summary of Progress/Problems:Goal work on D/C plan. Rated day 10/10.  Mark [name] [place] 11/07/2023, 10:11 PM

## 2023-11-07 NOTE — Group Note (Signed)
 Recreation Therapy Group Note   Group Topic:Communication  Group Date: 11/07/2023 Start Time: 0935 End Time: 1000 Facilitators: Bintou Lafata-McCall, LRT,CTRS Location: [address] Dayroom   Group Topic: Communication, Problem Solving   Goal Area(s) Addresses:  Patient will effectively listen to complete activity.  Patient will identify communication skills used to make activity successful.  Patient will identify how skills used during activity can be used to reach post d/c goals.    Behavioral Response:    Intervention: Building surveyor Activity - Geometric pattern cards, pencils, blank paper    Activity: Geometric Drawings.  Three volunteers from the peer group will be shown an abstract picture with a particular arrangement of geometrical shapes.  Each round, one 'speaker' will describe the pattern, as accurately as possible without revealing the image to the group.  The remaining group members will listen and draw the picture to reflect how it is described to them. Patients with the role of 'listener' cannot ask clarifying questions but, may request that the speaker repeat a direction. Once the drawings are complete, the presenter will show the rest of the group the picture and compare how close each person came to drawing the picture. LRT will facilitate a post-activity discussion regarding effective communication and the importance of planning, listening, and asking for clarification in daily interactions with others.  Education: Environmental consultant, Active listening, Support systems, Discharge planning  Education Outcome: Acknowledges understanding/In group clarification offered/Needs additional education.    Affect/Mood: N/A   Participation Level: Did not attend    Clinical Observations/Individualized Feedback:     Plan: Continue to engage patient in RT group sessions 2-3x/week.   Alastair Hennes-McCall, LRT,CTRS 11/07/2023 11:43 AM

## 2023-11-07 NOTE — Plan of Care (Signed)
   Problem: Education: Goal: Emotional status will improve Outcome: Progressing Goal: Mental status will improve Outcome: Progressing Goal: Verbalization of understanding the information provided will improve Outcome: Progressing   Problem: Activity: Goal: Interest or engagement in activities will improve Outcome: Progressing

## 2023-11-07 NOTE — BH IP Treatment Plan (Addendum)
 Interdisciplinary Treatment and Diagnostic Plan Update  11/07/2023 Time of Session: 10:55 AM Mark [name] MRN: [id number]  Principal Diagnosis: Bipolar I disorder, most recent episode mixed (HCC)  Secondary Diagnoses: Principal Problem:   Bipolar I disorder, most recent episode mixed (HCC) Active Problems:   Impulse control disorder in adult   GAD (generalized anxiety disorder)   PTSD (post-traumatic stress disorder)   Cannabis use with anxiety disorder (HCC)   MDD (major depressive disorder), recurrent episode, severe (HCC)   Current Medications:  Current Facility-Administered Medications  Medication Dose Route Frequency Provider Last Rate Last Admin   acetaminophen  (TYLENOL ) tablet 650 mg  650 mg Oral Q6H PRN Arloa Suzen RAMAN, NP   650 mg at 11/07/23 1506   alum & mag hydroxide-simeth (MAALOX/MYLANTA) 200-200-20 MG/5ML suspension 30 mL  30 mL Oral Q4H PRN Arloa Suzen RAMAN, NP       haloperidol  (HALDOL ) tablet 5 mg  5 mg Oral TID PRN Arloa Suzen RAMAN, NP       And   diphenhydrAMINE  (BENADRYL ) capsule 50 mg  50 mg Oral TID PRN Arloa Suzen RAMAN, NP       haloperidol  lactate (HALDOL ) injection 5 mg  5 mg Intramuscular TID PRN Arloa Suzen RAMAN, NP       And   diphenhydrAMINE  (BENADRYL ) injection 50 mg  50 mg Intramuscular TID PRN Arloa Suzen RAMAN, NP       And   LORazepam  (ATIVAN ) injection 2 mg  2 mg Intramuscular TID PRN Arloa Suzen RAMAN, NP       haloperidol  lactate (HALDOL ) injection 10 mg  10 mg Intramuscular TID PRN Arloa Suzen RAMAN, NP       And   diphenhydrAMINE  (BENADRYL ) injection 50 mg  50 mg Intramuscular TID PRN Arloa Suzen RAMAN, NP       And   LORazepam  (ATIVAN ) injection 2 mg  2 mg Intramuscular TID PRN Arloa Suzen RAMAN, NP       divalproex  (DEPAKOTE  ER) 24 hr tablet 2,000 mg  2,000 mg Oral QHS Arloa Suzen RAMAN, NP   2,000 mg at 11/06/23 2108   doxycycline  (VIBRA -TABS) tablet 100 mg  100 mg Oral BID Arloa Suzen RAMAN, NP   100 mg at  11/07/23 1725   hydrOXYzine  (ATARAX ) tablet 25 mg  25 mg Oral TID PRN Arloa Suzen RAMAN, NP       lurasidone  (LATUDA ) tablet 20 mg  20 mg Oral QHS Ntuen, Tina C, FNP   20 mg at 11/06/23 2108   magnesium  hydroxide (MILK OF MAGNESIA) suspension 30 mL  30 mL Oral Daily PRN Arloa Suzen RAMAN, NP       nicotine  (NICODERM CQ  - dosed in mg/24 hours) patch 14 mg  14 mg Transdermal Daily PRN Arloa Suzen RAMAN, NP   14 mg at 11/06/23 1714   predniSONE  (DELTASONE ) tablet 20 mg  20 mg Oral Q breakfast Arloa Suzen RAMAN, NP   20 mg at 11/07/23 0830   traZODone  (DESYREL ) tablet 200 mg  200 mg Oral QHS Arloa Suzen RAMAN, NP   200 mg at 11/06/23 2108   PTA Medications: Medications Prior to Admission  Medication Sig Dispense Refill Last Dose/Taking   divalproex  (DEPAKOTE  ER) 500 MG 24 hr tablet Take 4 tablets (2,000 mg total) by mouth at bedtime. 360 tablet 0    doxycycline  (VIBRAMYCIN ) 100 MG capsule Take 1 capsule (100 mg total) by mouth 2 (two) times daily. 20 capsule 0    FLUoxetine  (PROZAC ) 20 MG capsule  Take 1 capsule (20 mg total) by mouth daily. 90 capsule 0    traZODone  (DESYREL ) 100 MG tablet Take 2 tablets by mouth at bedtime.       Patient Stressors: Health problems   Other: Medication ineffeicency     Patient Strengths: Motivation for treatment/growth  Supportive family/friends  Work skills   Treatment Modalities: Medication Management, Group therapy, Case management,  1 to 1 session with clinician, Psychoeducation, Recreational therapy.   Physician Treatment Plan for Primary Diagnosis: Bipolar I disorder, most recent episode mixed (HCC) Long Term Goal(s): Improvement in symptoms so as ready for discharge   Short Term Goals: Ability to identify changes in lifestyle to reduce recurrence of condition will improve Ability to verbalize feelings will improve Ability to disclose and discuss suicidal ideas Ability to demonstrate self-control will improve Ability to identify and develop  effective coping behaviors will improve Ability to maintain clinical measurements within normal limits will improve Compliance with prescribed medications will improve Ability to identify triggers associated with substance abuse/mental health issues will improve  Medication Management: Evaluate patient's response, side effects, and tolerance of medication regimen.  Therapeutic Interventions: 1 to 1 sessions, Unit Group sessions and Medication administration.  Evaluation of Outcomes: Not Progressing  Physician Treatment Plan for Secondary Diagnosis: Principal Problem:   Bipolar I disorder, most recent episode mixed (HCC) Active Problems:   Impulse control disorder in adult   GAD (generalized anxiety disorder)   PTSD (post-traumatic stress disorder)   Cannabis use with anxiety disorder (HCC)   MDD (major depressive disorder), recurrent episode, severe (HCC)  Long Term Goal(s): Improvement in symptoms so as ready for discharge   Short Term Goals: Ability to identify changes in lifestyle to reduce recurrence of condition will improve Ability to verbalize feelings will improve Ability to disclose and discuss suicidal ideas Ability to demonstrate self-control will improve Ability to identify and develop effective coping behaviors will improve Ability to maintain clinical measurements within normal limits will improve Compliance with prescribed medications will improve Ability to identify triggers associated with substance abuse/mental health issues will improve     Medication Management: Evaluate patient's response, side effects, and tolerance of medication regimen.  Therapeutic Interventions: 1 to 1 sessions, Unit Group sessions and Medication administration.  Evaluation of Outcomes: Not Progressing   RN Treatment Plan for Primary Diagnosis: Bipolar I disorder, most recent episode mixed (HCC) Long Term Goal(s): Knowledge of disease and therapeutic regimen to maintain health will  improve  Short Term Goals: Ability to remain free from injury will improve, Ability to verbalize frustration and anger appropriately will improve, Ability to demonstrate self-control, Ability to participate in decision making will improve, Ability to verbalize feelings will improve, Ability to disclose and discuss suicidal ideas, Ability to identify and develop effective coping behaviors will improve, and Compliance with prescribed medications will improve  Medication Management: RN will administer medications as ordered by provider, will assess and evaluate patient's response and provide education to patient for prescribed medication. RN will report any adverse and/or side effects to prescribing provider.  Therapeutic Interventions: 1 on 1 counseling sessions, Psychoeducation, Medication administration, Evaluate responses to treatment, Monitor vital signs and CBGs as ordered, Perform/monitor CIWA, COWS, AIMS and Fall Risk screenings as ordered, Perform wound care treatments as ordered.  Evaluation of Outcomes: Not Progressing   LCSW Treatment Plan for Primary Diagnosis: Bipolar I disorder, most recent episode mixed (HCC) Long Term Goal(s): Safe transition to appropriate next level of care at discharge, Engage patient in therapeutic group  addressing interpersonal concerns.  Short Term Goals: Engage patient in aftercare planning with referrals and resources, Increase social support, Increase ability to appropriately verbalize feelings, Increase emotional regulation, Facilitate acceptance of mental health diagnosis and concerns, Facilitate patient progression through stages of change regarding substance use diagnoses and concerns, Identify triggers associated with mental health/substance abuse issues, and Increase skills for wellness and recovery  Therapeutic Interventions: Assess for all discharge needs, 1 to 1 time with Social worker, Explore available resources and support systems, Assess for  adequacy in community support network, Educate family and significant other(s) on suicide prevention, Complete Psychosocial Assessment, Interpersonal group therapy.  Evaluation of Outcomes: Not Progressing   Progress in Treatment: Attending groups: Yes. Participating in groups: Yes. Taking medication as prescribed: Yes. Toleration medication: Yes. Family/Significant other contact made: Yes, individual(s) contacted:  Elinda Margo (significant other) [phone] Patient understands diagnosis: Yes. Discussing patient identified problems/goals with staff: Yes. Medical problems stabilized or resolved: Yes. Denies suicidal/homicidal ideation: Yes. Issues/concerns per patient self-inventory: No.   New problem(s) identified:  No  New Short Term/Long Term Goal(s):    medication stabilization, elimination of SI thoughts, development of comprehensive mental wellness plan.    Patient Goals:  I want to get my medication under control so I don't have outbursts.  I have bipolar disorder and PTSD.    Discharge Plan or Barriers:  Patient recently admitted. CSW will continue to follow and assess for appropriate referrals and possible discharge planning.    Reason for Continuation of Hospitalization: Anxiety Depression Medication stabilization Suicidal ideation  Estimated Length of Stay:  5 - 7 days  Last 3 [place] Suicide Severity Risk Score: Flowsheet Row Ad[facility] (Current) [facility] Most recent reading at 11/05/2023  9:00 PM ED from 11/05/2023 in [facility] Most recent reading at 11/05/2023 12:51 PM ED from 11/02/2023 in [facility] Emergency Department at [facility] Most recent reading at 11/02/2023  2:34 PM  C-SSRS RISK CATEGORY High Risk High Risk No Risk    Last PHQ 2/9 Scores:     No data to display          Scribe for Treatment Team: Kristianna Saperstein O Armand Preast, LCSWA 11/07/2023 7:43  PM

## 2023-11-07 NOTE — Plan of Care (Signed)
   Problem: Education: Goal: Emotional status will improve Outcome: Progressing Goal: Mental status will improve Outcome: Progressing Goal: Verbalization of understanding the information provided will improve Outcome: Progressing

## 2023-11-07 NOTE — Progress Notes (Signed)
 [facility] MD Progress Note  11/07/2023 5:18 PM Mark [name]  MRN:  [id number]  Subjective: Reason for admission: 33 year old Caucasian male with prior hx of IDD, Bipolar disorder, ADHD, PTSD, poor impulse control, Cannabis use disorder & MDD.  Patient presents voluntarily to the [facility] from [facility] with worsening depression triggering in suicidal ideation with plan to jump off a bridge. Symptoms triggered by psychosocial stressors & noncompliant to treatment regimen. A review of the current lab results has shown his  BAL <15, UDS positive for THC.   Daily notes: Mark [name] is seen this morning. Chart reviewed. The chart finding discussed with the treatment team this afternoon. Patient presents alert, oriented & aware of situation. However, he presents with hypomanic symptoms manifested as pressured speech, jumping from topic to topic. He is a bit difficult to redirect. He reports, I have been here x two days due to mood instability. I'm trying to have a stable mood. I was started on Prozac  last April & since I started that medicine, my mood has gone down hill. I'm taking the right medicines now. I'm feeling a little better. I was on Depakote  for seizures & I was informed that this medicine is also used to stabilize mood instability. I have been up this morning, gone to the day room for the morning group sessions. I feel okay. I just finished talking to the Dr.few minutes ago. When I'm done with you, I will go call my fiance & update her based on what the doctor told me few minutes ago. Mark [name] currently denies any SIHI, AVH, delusional thoughts or paranoia. He does not appear to be responding to any internal stimuli. Patient is encouraged to continue to take his treatment regimen as recommended. Instructed & encouraged to report any side effects promptly to the staff. Reviewed vital signs, stable. Reviewed current lab results, there are no significant changes noted. Continue current plan of care as already in progress.  Vital signs remain stable.   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   Past Medical History:  Past Medical History:  Diagnosis Date   ADHD (attention deficit hyperactivity disorder)    Bipolar 1 disorder (HCC)    Mental retardation     Past Surgical History:  Procedure Laterality Date   NO PAST SURGERIES     Family History:  Family History  Problem Relation Age of Onset   Hypertension Mother    Hyperlipidemia Mother    Family Psychiatric  History: See H&P.  Social History:  Social History   Substance and Sexual Activity  Alcohol Use No     Social History   Substance and Sexual Activity  Drug Use Yes   Types: Marijuana    Social History   Socioeconomic History   Marital status: Single    Spouse name: Not on file   Number of children: Not on file   Years of education: Not on file   Highest education level: Not on file  Occupational History   Not on file  Tobacco Use   Smoking status: Every Day    Current  packs/day: 1.00    Average packs/day: 1 pack/day for 0.7 years (0.7 ttl pk-yrs)    Types: Cigarettes    Start date: 2025   Smokeless tobacco: Not on file  Substance and Sexual Activity   Alcohol use: No   Drug use: Yes    Types: Marijuana   Sexual activity: Yes  Other Topics Concern   Not on file  Social History Narrative   ** Merged History Encounter **       Social Drivers of Health   Financial Resource Strain: Low Risk  (02/20/2023)   Received from [facility]   Overall Financial Resource Strain (CARDIA)     Difficulty of Paying Living Expenses: Not hard at all  Food Insecurity: No Food Insecurity (11/05/2023)   Hunger Vital Sign    Worried About Running Out of Food in the Last Year: Never true    Ran Out of Food in the Last Year: Never true  Transportation Needs: No Transportation Needs (11/05/2023)   PRAPARE - Administrator, Civil Service (Medical): No    Lack of Transportation (Non-Medical): No  Physical Activity: Sufficiently Active (02/20/2023)   Received from [facility]   Exercise Vital Sign    On average, how many days per week do you engage in moderate to strenuous exercise (like a brisk walk)?: 5 days    On average, how many minutes do you engage in exercise at this level?: 120 min  Stress: Stress Concern Present (02/20/2023)   Received from [facility] - Occupational Stress Questionnaire    Feeling of Stress : To some extent  Social Connections: Somewhat Isolated (02/20/2023)   Received from [facility]   Social Network    How would you rate your social network (family, work, friends)?: Restricted participation with some degree of social isolation   Additional Social History:   Sleep: Good Estimated Sleeping Duration (Last 24 Hours): 6.25-7.50 hours  Appetite:  Good  Current Medications: Current Facility-Administered Medications  Medication Dose Route Frequency Provider Last Rate Last Admin   acetaminophen  (TYLENOL ) tablet 650 mg  650 mg Oral Q6H PRN Arloa Suzen RAMAN, NP   650 mg at 11/07/23 1506   alum & mag hydroxide-simeth (MAALOX/MYLANTA) 200-200-20 MG/5ML suspension 30 mL  30 mL Oral Q4H PRN Arloa Suzen RAMAN, NP       haloperidol  (HALDOL ) tablet 5 mg  5 mg Oral TID PRN Arloa Suzen RAMAN, NP       And   diphenhydrAMINE  (BENADRYL ) capsule 50 mg  50 mg Oral TID PRN Arloa Suzen RAMAN, NP       haloperidol  lactate (HALDOL ) injection 5 mg  5 mg Intramuscular TID PRN Arloa Suzen RAMAN, NP       And    diphenhydrAMINE  (BENADRYL ) injection 50 mg  50 mg Intramuscular TID PRN Arloa Suzen RAMAN, NP       And   LORazepam  (ATIVAN ) injection 2 mg  2 mg Intramuscular TID PRN Arloa Suzen RAMAN, NP       haloperidol  lactate (HALDOL ) injection 10 mg  10 mg Intramuscular TID PRN Arloa Suzen RAMAN, NP       And   diphenhydrAMINE  (BENADRYL ) injection 50 mg  50 mg Intramuscular TID PRN Arloa Suzen RAMAN, NP       And   LORazepam  (ATIVAN ) injection 2 mg  2 mg Intramuscular TID PRN Arloa Suzen RAMAN, NP       divalproex  (DEPAKOTE  ER) 24 hr  tablet 2,000 mg  2,000 mg Oral QHS Arloa Suzen RAMAN, NP   2,000 mg at 11/06/23 2108   doxycycline  (VIBRA -TABS) tablet 100 mg  100 mg Oral BID Arloa Suzen RAMAN, NP   100 mg at 11/07/23 9170   gabapentin  (NEURONTIN ) capsule 300 mg  300 mg Oral TID Arloa Suzen RAMAN, NP   300 mg at 11/07/23 1243   hydrOXYzine  (ATARAX ) tablet 25 mg  25 mg Oral TID PRN Arloa Suzen RAMAN, NP       lurasidone  (LATUDA ) tablet 20 mg  20 mg Oral QHS Ntuen, Tina C, FNP   20 mg at 11/06/23 2108   magnesium  hydroxide (MILK OF MAGNESIA) suspension 30 mL  30 mL Oral Daily PRN Arloa Suzen RAMAN, NP       nicotine  (NICODERM CQ  - dosed in mg/24 hours) patch 14 mg  14 mg Transdermal Daily PRN Arloa Suzen RAMAN, NP   14 mg at 11/06/23 1714   predniSONE  (DELTASONE ) tablet 20 mg  20 mg Oral Q breakfast Arloa Suzen RAMAN, NP   20 mg at 11/07/23 0830   traZODone  (DESYREL ) tablet 200 mg  200 mg Oral QHS Arloa Suzen RAMAN, NP   200 mg at 11/06/23 2108   Lab Results:  Results for orders placed or performed during the hospital encounter of 11/05/23 (from the past 48 hours)  CBC with Differential/Platelet     Status: Abnormal   Collection Time: 11/06/23  6:25 AM  Result Value Ref Range   WBC 17.8 (H) 4.0 - 10.5 K/uL   RBC 4.75 4.22 - 5.81 MIL/uL   Hemoglobin 13.8 13.0 - 17.0 g/dL   HCT 56.6 60.9 - 47.9 %   MCV 91.2 80.0 - 100.0 fL   MCH 29.1 26.0 - 34.0 pg   MCHC 31.9 30.0 - 36.0 g/dL   RDW 87.6  88.4 - 84.4 %   Platelets 271 150 - 400 K/uL   nRBC 0.0 0.0 - 0.2 %   Neutrophils Relative % 67 %   Neutro Abs 12.2 (H) 1.7 - 7.7 K/uL   Lymphocytes Relative 18 %   Lymphs Abs 3.2 0.7 - 4.0 K/uL   Monocytes Relative 11 %   Monocytes Absolute 1.9 (H) 0.1 - 1.0 K/uL   Eosinophils Relative 2 %   Eosinophils Absolute 0.3 0.0 - 0.5 K/uL   Basophils Relative 1 %   Basophils Absolute 0.1 0.0 - 0.1 K/uL   Immature Granulocytes 1 %   Abs Immature Granulocytes 0.16 (H) 0.00 - 0.07 K/uL    Comment: Performed at [facility], [address], [place], [place] [address]  Folate     Status: None   Collection Time: 11/06/23  6:24 PM  Result Value Ref Range   Folate 10.6 >5.9 ng/mL    Comment: Performed at [facility], [address] [place], [place] [address]  Lipid panel     Status: Abnormal   Collection Time: 11/06/23  6:24 PM  Result Value Ref Range   Cholesterol 185 0 - 200 mg/dL    Comment:        ATP III CLASSIFICATION:  <200     mg/dL   Desirable  799-760  mg/dL   Borderline High  >=759    mg/dL   High           Triglycerides 148 <150 mg/dL   HDL 40 (L) >59 mg/dL   Total CHOL/HDL Ratio 4.6 RATIO   VLDL 30 0 - 40 mg/dL  LDL Cholesterol 115 (H) 0 - 99 mg/dL    Comment:        Total Cholesterol/HDL:CHD Risk Coronary Heart Disease Risk Table                     Men   Women  1/2 Average Risk   3.4   3.3  Average Risk       5.0   4.4  2 X Average Risk   9.6   7.1  3 X Average Risk  23.4   11.0        Use the calculated Patient Ratio above and the CHD Risk Table to determine the patient's CHD Risk.        ATP III CLASSIFICATION (LDL):  <100     mg/dL   Optimal  899-870  mg/dL   Near or Above                    Optimal  130-159  mg/dL   Borderline  839-810  mg/dL   High  >809     mg/dL   Very High Performed at [facility], [address], [place], [place] [address]   RPR     Status: None   Collection Time:  11/06/23  6:24 PM  Result Value Ref Range   RPR Ser Ql NON REACTIVE NON REACTIVE    Comment: Performed at [facility], [address] [place], [place] [address]  Vitamin B12     Status: None   Collection Time: 11/06/23  6:24 PM  Result Value Ref Range   Vitamin B-12 286 180 - 914 pg/mL    Comment: Performed at [facility], [address], [place], [place] [address]  VITAMIN D  25 Hydroxy (Vit-D Deficiency, Fractures)     Status: None   Collection Time: 11/06/23  6:24 PM  Result Value Ref Range   Vit D, 25-Hydroxy 38.09 30 - 100 ng/mL    Comment: (NOTE) Vitamin D  deficiency has been defined by the Institute of Medicine  and an Endocrine Society practice guideline as a level of serum 25-OH  vitamin D  less than 20 ng/mL (1,2). The Endocrine Society went on to  further define vitamin D  insufficiency as a level between 21 and 29  ng/mL (2).  1. IOM (Institute of Medicine). 2010. Dietary reference intakes for  calcium and D. [place]: [employer]. 2. Holick MF, Bink[place], Bischoff-Ferrari HA, et al. Evaluation,  treatment, and prevention of vitamin D  deficiency: an Endocrine  Society clinical practice guideline, JCEM. 2011 Jul; 96(7): 1911-30.  Performed at [facility], [address], [place], [place] [address]   HIV Antibody (routine testing w rflx)     Status: None   Collection Time: 11/06/23  6:24 PM  Result Value Ref Range   HIV Screen 4th Generation wRfx Non Reactive Non Reactive    Comment: Performed at [facility], [address] [place], [place] [address]   Blood Alcohol level:  Lab Results  Component Value Date   [facility] <15 11/02/2023   ETH <5 09/03/2014   Metabolic Disorder Labs: Lab Results  Component Value Date   HGBA1C 5.2 11/05/2023   MPG 102.54 11/05/2023   MPG 154 09/04/2014   No results found for: PROLACTIN Lab Results  Component Value Date   CHOL 185 11/06/2023   TRIG 148 11/06/2023   HDL  40 (L) 11/06/2023  CHOLHDL 4.6 11/06/2023   VLDL 30 11/06/2023   LDLCALC 115 (H) 11/06/2023   LDLCALC 77 11/05/2023   Physical Findings: AIMS:  ,  ,  ,  ,  ,  ,   CIWA:    COWS:     Musculoskeletal: Strength & Muscle Tone: within normal limits Gait & Station: normal Patient leans: N/A  Psychiatric Specialty Exam:  Presentation  General Appearance:  Casual; Fairly Groomed  Eye Contact: Good  Speech: Clear and Coherent (talkative.)  Speech Volume: Increased  Handedness: Right  Mood and Affect  Mood: Labile  Affect: Congruent  Thought Process  Thought Processes: Coherent  Descriptions of Associations:Intact  Orientation:Full (Time, Place and Person)  Thought Content:Logical  History of Schizophrenia/Schizoaffective disorder:No  Duration of Psychotic Symptoms:No data recorded Hallucinations:Hallucinations: None  Ideas of Reference:None  Suicidal Thoughts:Suicidal Thoughts: No  Homicidal Thoughts:Homicidal Thoughts: No  Sensorium  Memory: Immediate Good; Recent Good; Remote Good  Judgment: Fair  Insight: Fair  Art therapist  Concentration: Fair  Attention Span: Fair  Recall: Good  Fund of Knowledge: Fair  Language: Good  Psychomotor Activity  Psychomotor Activity:Psychomotor Activity: Increased  Assets  Assets: Communication Skills; Desire for Improvement; Housing; Social Support  Sleep  Sleep:Sleep: Good Number of Hours of Sleep: 7.5  Physical Exam: Physical Exam Vitals and nursing note reviewed.  HENT:     Head: Normocephalic.     Nose: Nose normal.  Cardiovascular:     Rate and Rhythm: Normal rate.     Pulses: Normal pulses.  Genitourinary:    Comments: Deferred. Musculoskeletal:        General: Normal range of motion.     Cervical back: Normal range of motion.  Skin:    General: Skin is dry.  Neurological:     General: No focal deficit present.     Mental Status: He is alert and oriented to  person, place, and time.    Review of Systems  Constitutional:  Negative for chills, diaphoresis and fever.  HENT:  Negative for congestion and sore throat.   Respiratory:  Negative for cough, shortness of breath and wheezing.   Cardiovascular:  Negative for chest pain and palpitations.  Gastrointestinal:  Negative for abdominal pain, constipation, diarrhea, heartburn, nausea and vomiting.  Musculoskeletal:  Negative for joint pain and myalgias.  Neurological:  Negative for dizziness, tingling, tremors, sensory change, speech change, focal weakness, seizures, loss of consciousness, weakness and headaches.  Psychiatric/Behavioral:  Positive for depression and substance abuse. Negative for hallucinations, memory loss and suicidal ideas. The patient is nervous/anxious and has insomnia.    Blood pressure 127/72, pulse 69, temperature 97.9 F (36.6 C), temperature source Oral, resp. rate 16, height 6' 2 (1.88 m), weight 87.1 kg, SpO2 100%. Body mass index is 24.65 kg/m.  Treatment Plan Summary: Daily contact with patient to assess and evaluate symptoms and progress in treatment and Medication management.   Principal/active diagnoses.  Bipolar 1 disorder, mixed episodes.  GAD.  Poor impulse control in adult.  Plan: The risks/benefits/side-effects/alternatives to the medications in use were discussed in detail with the patient and time was given for patient's questions. The patient consents to medication trial.   Continue Latuda  20 mg po Q bedtime for mood control.  Continue Depakote  2000 mg po at bedtime for mood stabilization.  Continue trazodone  200 mg po Q hs for insomnia. Continue hydroxyzine  25 mg tid prn for anxiety.   Agitation protocols.  Continue as recommended (See MAR).   Other medical conditions: Continue  doxycycline  100 mg po bid for right foot infection Continue nicotine  patch 21 mg trans-dermally Q 24 hours for Nicotine  withdrawal management. Continue prednisone  20 mg  po daily with breakfast for inflammation.  Other PRNS -Continue Tylenol  650 mg every 6 hours PRN for mild pain -Continue Maalox 30 ml Q 4 hrs PRN for indigestion -Continue MOM 30 ml po Q 6 hrs for constipation  Safety and Monitoring: Voluntary admission to inpatient psychiatric unit for safety, stabilization and treatment Daily contact with patient to assess and evaluate symptoms and progress in treatment Patient's case to be discussed in multi-disciplinary team meeting Observation Level : q15 minute checks Vital signs: q12 hours Precautions: Safety  Discharge Planning: Social work and case management to assist with discharge planning and identification of hospital follow-up needs prior to discharge Estimated LOS: 5-7 days Discharge Concerns: Need to establish a safety plan; Medication compliance and effectiveness Discharge Goals: Return home with outpatient referrals for mental health follow-up including medication management/psychotherapy  Mac Bolster, NP, pmhnp, fnp-bc. 11/07/2023, 5:18 PM

## 2023-11-07 NOTE — Group Note (Signed)
 Date:  11/07/2023 Time:  6:00 PM  Group Topic/Focus:  Coping With Mental Health Crisis:   The purpose of this group is to help patients identify strategies for coping with mental health crisis.  Group discusses possible causes of crisis and ways to manage them effectively. Developing a Wellness Toolbox:   The focus of this group is to help patients develop a wellness toolbox with skills and strategies to promote recovery upon discharge. Identifying Needs:   The focus of this group is to help patients identify their personal needs that have been historically problematic and identify healthy behaviors to address their needs.    Participation Level:  Active  Participation Quality:  Intrusive and Monopolizing  Affect:  Appropriate  Cognitive:  Alert  Insight: Lacking  Engagement in Group:  Distracting and Monopolizing  Modes of Intervention:  Discussion  Additional Comments:    Eleanor JAYSON Metro 11/07/2023, 6:00 PM

## 2023-11-08 MED ORDER — NICOTINE POLACRILEX 2 MG MT GUM
2.0000 mg | CHEWING_GUM | OROMUCOSAL | Status: DC | PRN
  Administered 2023-11-08 – 2023-11-10 (×8): 2 mg via ORAL
  Filled 2023-11-08 (×2): qty 1

## 2023-11-08 NOTE — Group Note (Signed)
 Date:  11/08/2023 Time:  9:29 PM  Group Topic/Focus:  Wrap-Up Group:   The focus of this group is to help patients review their daily goal of treatment and discuss progress on daily workbooks.    Participation Level:  Active  Participation Quality:  Appropriate and Attentive  Affect:  Appropriate  Cognitive:  Alert and Appropriate  Insight: Appropriate and Good  Engagement in Group:  Engaged  Modes of Intervention:  Discussion and Education  Additional Comments:  Pt attended and participated in wrap up group this evening and rated their day a 10/10. Pt stated that their meds have been stabilized and that they are now feeling better. Pt is ready for their anticipated D/C date on Saturday. Pt completed their goal which was to receive resources for 1:1 therapy.   Mark [name] 11/08/2023, 9:29 PM

## 2023-11-08 NOTE — Progress Notes (Signed)
   11/08/23 0900  Psych Admission Type (Psych Patients Only)  Admission Status Voluntary  Psychosocial Assessment  Patient Complaints None  Eye Contact Fair  Facial Expression Animated  Affect Anxious  Speech Logical/coherent  Interaction Assertive  Motor Activity Slow  Appearance/Hygiene Unremarkable  Behavior Characteristics Cooperative;Appropriate to situation  Mood Pleasant  Thought Process  Coherency WDL  Content WDL  Delusions None reported or observed  Perception WDL  Hallucination None reported or observed  Judgment Impaired  Confusion None  Danger to Self  Current suicidal ideation? Denies  Self-Injurious Behavior No self-injurious ideation or behavior indicators observed or expressed   Agreement Not to Harm Self Yes  Description of Agreement verbal  Danger to Others  Danger to Others None reported or observed

## 2023-11-08 NOTE — Progress Notes (Signed)
 [facility] MD Progress Note  11/08/2023 5:00 PM Mark [name]  MRN:  [id number]  Subjective: Reason for admission: 33 year old Caucasian male with prior hx of IDD, Bipolar disorder, ADHD, PTSD, poor impulse control, Cannabis use disorder & MDD.  Patient presents voluntarily to the [facility] from [facility] with worsening depression triggering in suicidal ideation with plan to jump off a bridge. Symptoms triggered by psychosocial stressors & noncompliant to treatment regimen. A review of the current lab results has shown his  BAL <15, UDS positive for THC.   Daily notes: Mark [name] is seen this morning. Chart reviewed. The chart finding discussed with the treatment team this afternoon. Patient presents alert, oriented & aware of situation. He remains visible on the unit, attending group sessions. He reports, My mood is improving. I feel more stable than I have ever felt in a while. I slept well last night. I'm taking my medicines, no side effects to report. I am attending & enjoying group sessions. I'm thinking that it is time for me to start thinking about getting discharged. Mark [name] denies any SIHI, AVH, delusional thoughts or paranoia. He does not appear to be responding to any internal stimuli. Patient is encouraged to continue to take his treatment regimen as recommended. Instructed & encouraged to report any side effects promptly to the staff. Reviewed vital signs, stable. Yesterday, patient appeared hypomanic. Today, he presents a bit calmer, less talkative & much reasonable. The attending psychiatrist states that if patient continues to maintain the stable mood seen today, is likely to be discharged on Saturday 11-10-23.  Reviewed current lab results, there are no significant changes noted. Continue current plan of care as already in progress. 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   Past Medical History:  Past Medical History:  Diagnosis Date   ADHD (attention deficit hyperactivity disorder)    Bipolar 1 disorder (HCC)    Mental retardation     Past Surgical History:  Procedure Laterality Date   NO PAST SURGERIES     Family History:  Family History  Problem Relation Age of Onset   Hypertension Mother    Hyperlipidemia Mother    Family Psychiatric  History: See H&P.  Social History:  Social History   Substance and Sexual Activity  Alcohol Use No     Social History   Substance and Sexual Activity  Drug Use Yes   Types: Marijuana    Social History   Socioeconomic History   Marital status: Single    Spouse name: Not on file   Number of children: Not on file   Years of education: Not on file   Highest education level: Not on file  Occupational History   Not on file  Tobacco Use   Smoking status: Every Day    Current packs/day: 1.00    Average packs/day: 1 pack/day for 0.7 years (0.7 ttl pk-yrs)    Types: Cigarettes    Start date: 2025   Smokeless tobacco: Not on file  Substance and Sexual Activity   Alcohol use: No  Drug use: Yes    Types: Marijuana   Sexual activity: Yes  Other Topics Concern   Not on file  Social History Narrative   ** Merged History Encounter **       Social Drivers of Health   Financial Resource Strain: Low Risk  (02/20/2023)   Received from [facility]   Overall Financial Resource Strain (CARDIA)    Difficulty of Paying Living Expenses: Not hard at all  Food Insecurity: No Food Insecurity (11/05/2023)   Hunger Vital Sign    Worried About Running Out of Food in the  Last Year: Never true    Ran Out of Food in the Last Year: Never true  Transportation Needs: No Transportation Needs (11/05/2023)   PRAPARE - Administrator, Civil Service (Medical): No    Lack of Transportation (Non-Medical): No  Physical Activity: Sufficiently Active (02/20/2023)   Received from [facility]   Exercise Vital Sign    On average, how many days per week do you engage in moderate to strenuous exercise (like a brisk walk)?: 5 days    On average, how many minutes do you engage in exercise at this level?: 120 min  Stress: Stress Concern Present (02/20/2023)   Received from [facility] - Occupational Stress Questionnaire    Feeling of Stress : To some extent  Social Connections: Somewhat Isolated (02/20/2023)   Received from [facility]   Social Network    How would you rate your social network (family, work, friends)?: Restricted participation with some degree of social isolation   Additional Social History:   Sleep: Good Estimated Sleeping Duration (Last 24 Hours): 7.00-7.50 hours  Appetite:  Good  Current Medications: Current Facility-Administered Medications  Medication Dose Route Frequency Provider Last Rate Last Admin   acetaminophen  (TYLENOL ) tablet 650 mg  650 mg Oral Q6H PRN Arloa Suzen RAMAN, NP   650 mg at 11/08/23 1515   alum & mag hydroxide-simeth (MAALOX/MYLANTA) 200-200-20 MG/5ML suspension 30 mL  30 mL Oral Q4H PRN Arloa Suzen RAMAN, NP       haloperidol  (HALDOL ) tablet 5 mg  5 mg Oral TID PRN Arloa Suzen RAMAN, NP       And   diphenhydrAMINE  (BENADRYL ) capsule 50 mg  50 mg Oral TID PRN Arloa Suzen RAMAN, NP       haloperidol  lactate (HALDOL ) injection 5 mg  5 mg Intramuscular TID PRN Arloa Suzen RAMAN, NP       And   diphenhydrAMINE  (BENADRYL ) injection 50 mg  50 mg Intramuscular TID PRN Arloa Suzen RAMAN, NP       And   LORazepam  (ATIVAN ) injection 2 mg  2 mg Intramuscular TID PRN  Arloa Suzen RAMAN, NP       haloperidol  lactate (HALDOL ) injection 10 mg  10 mg Intramuscular TID PRN Arloa Suzen RAMAN, NP       And   diphenhydrAMINE  (BENADRYL ) injection 50 mg  50 mg Intramuscular TID PRN Arloa Suzen RAMAN, NP       And   LORazepam  (ATIVAN ) injection 2 mg  2 mg Intramuscular TID PRN Arloa Suzen RAMAN, NP       divalproex  (DEPAKOTE  ER) 24 hr tablet 2,000 mg  2,000 mg Oral QHS Arloa Suzen RAMAN, NP   2,000 mg at 11/07/23 2143   doxycycline  (VIBRA -TABS) tablet 100 mg  100 mg Oral BID Arloa Suzen RAMAN, NP   100 mg at 11/08/23 1645   hydrOXYzine  (  ATARAX ) tablet 25 mg  25 mg Oral TID PRN Arloa Suzen RAMAN, NP       lurasidone  (LATUDA ) tablet 20 mg  20 mg Oral QHS Ntuen, Tina C, FNP   20 mg at 11/07/23 2143   magnesium  hydroxide (MILK OF MAGNESIA) suspension 30 mL  30 mL Oral Daily PRN Arloa Suzen RAMAN, NP       nicotine  (NICODERM CQ  - dosed in mg/24 hours) patch 14 mg  14 mg Transdermal Daily PRN Arloa Suzen RAMAN, NP   14 mg at 11/06/23 1714   nicotine  polacrilex (NICORETTE ) gum 2 mg  2 mg Oral PRN Parker, Alvin S, MD   2 mg at 11/08/23 1517   predniSONE  (DELTASONE ) tablet 20 mg  20 mg Oral Q breakfast Arloa Suzen RAMAN, NP   20 mg at 11/08/23 9241   traZODone  (DESYREL ) tablet 200 mg  200 mg Oral QHS Arloa Suzen RAMAN, NP   200 mg at 11/07/23 2143   Lab Results:  Results for orders placed or performed during the hospital encounter of 11/05/23 (from the past 48 hours)  Folate     Status: None   Collection Time: 11/06/23  6:24 PM  Result Value Ref Range   Folate 10.6 >5.9 ng/mL    Comment: Performed at [facility], [address], [place], [place] [address]  Lipid panel     Status: Abnormal   Collection Time: 11/06/23  6:24 PM  Result Value Ref Range   Cholesterol 185 0 - 200 mg/dL    Comment:        ATP III CLASSIFICATION:  <200     mg/dL   Desirable  799-760  mg/dL   Borderline High  >=759    mg/dL   High           Triglycerides 148  <150 mg/dL   HDL 40 (L) >59 mg/dL   Total CHOL/HDL Ratio 4.6 RATIO   VLDL 30 0 - 40 mg/dL   LDL Cholesterol 884 (H) 0 - 99 mg/dL    Comment:        Total Cholesterol/HDL:CHD Risk Coronary Heart Disease Risk Table                     Men   Women  1/2 Average Risk   3.4   3.3  Average Risk       5.0   4.4  2 X Average Risk   9.6   7.1  3 X Average Risk  23.4   11.0        Use the calculated Patient Ratio above and the CHD Risk Table to determine the patient's CHD Risk.        ATP III CLASSIFICATION (LDL):  <100     mg/dL   Optimal  899-870  mg/dL   Near or Above                    Optimal  130-159  mg/dL   Borderline  839-810  mg/dL   High  >809     mg/dL   Very High Performed at [facility], [address] [place], [place] [address]   RPR     Status: None   Collection Time: 11/06/23  6:24 PM  Result Value Ref Range   RPR Ser Ql NON REACTIVE NON REACTIVE    Comment: Performed at [facility], [address], [place], [place] [address]  Vitamin B12  Status: None   Collection Time: 11/06/23  6:24 PM  Result Value Ref Range   Vitamin B-12 286 180 - 914 pg/mL    Comment: Performed at [facility], [address] [place], [place] [address]  VITAMIN D  25 Hydroxy (Vit-D Deficiency, Fractures)     Status: None   Collection Time: 11/06/23  6:24 PM  Result Value Ref Range   Vit D, 25-Hydroxy 38.09 30 - 100 ng/mL    Comment: (NOTE) Vitamin D  deficiency has been defined by the Institute of Medicine  and an Endocrine Society practice guideline as a level of serum 25-OH  vitamin D  less than 20 ng/mL (1,2). The Endocrine Society went on to  further define vitamin D  insufficiency as a level between 21 and 29  ng/mL (2).  1. IOM (Institute of Medicine). 2010. Dietary reference intakes for  calcium and D. [place]: [employer]. 2. Holick MF, Binkley [place], Bischoff-Ferrari HA, et al. Evaluation,  treatment, and  prevention of vitamin D  deficiency: an Endocrine  Society clinical practice guideline, JCEM. 2011 Jul; 96(7): 1911-30.  Performed at [facility], [address], [place], [place] [address]   HIV Antibody (routine testing w rflx)     Status: None   Collection Time: 11/06/23  6:24 PM  Result Value Ref Range   HIV Screen 4th Generation wRfx Non Reactive Non Reactive    Comment: Performed at [facility], [address], [place], [place] [address]   Blood Alcohol level:  Lab Results  Component Value Date   [facility] <15 11/02/2023   ETH <5 09/03/2014   Metabolic Disorder Labs: Lab Results  Component Value Date   HGBA1C 5.2 11/05/2023   MPG 102.54 11/05/2023   MPG 154 09/04/2014   No results found for: PROLACTIN Lab Results  Component Value Date   CHOL 185 11/06/2023   TRIG 148 11/06/2023   HDL 40 (L) 11/06/2023   CHOLHDL 4.6 11/06/2023   VLDL 30 11/06/2023   LDLCALC 115 (H) 11/06/2023   LDLCALC 77 11/05/2023   Physical Findings: AIMS:  ,  ,  ,  ,  ,  ,   CIWA:    COWS:     Musculoskeletal: Strength & Muscle Tone: within normal limits Gait & Station: normal Patient leans: N/A  Psychiatric Specialty Exam:  Presentation  General Appearance:  Casual; Fairly Groomed  Eye Contact: Good  Speech: Clear and Coherent (talkative.)  Speech Volume: Increased  Handedness: Right  Mood and Affect  Mood: Labile  Affect: Congruent  Thought Process  Thought Processes: Coherent  Descriptions of Associations:Intact  Orientation:Full (Time, Place and Person)  Thought Content:Logical  History of Schizophrenia/Schizoaffective disorder:No  Duration of Psychotic Symptoms:No data recorded Hallucinations:Hallucinations: None  Ideas of Reference:None  Suicidal Thoughts:Suicidal Thoughts: No  Homicidal Thoughts:Homicidal Thoughts: No  Sensorium  Memory: Immediate Good; Recent Good; Remote Good  Judgment: Fair  Insight: Fair  Forensic psychologist: Fair  Attention Span: Fair  Recall: Good  Fund of Knowledge: Fair  Language: Good  Psychomotor Activity  Psychomotor Activity:Psychomotor Activity: Increased  Assets  Assets: Communication Skills; Desire for Improvement; Housing; Social Support  Sleep  Sleep:Sleep: Good Number of Hours of Sleep: 7.5  Physical Exam: Physical Exam Vitals and nursing note reviewed.  HENT:     Head: Normocephalic.     Nose: Nose normal.  Cardiovascular:     Rate and Rhythm: Normal rate.     Pulses: Normal pulses.  Genitourinary:    Comments: Deferred. Musculoskeletal:        General: Normal range of motion.     Cervical back: Normal range of motion.  Skin:    General: Skin is dry.  Neurological:     General: No focal deficit present.     Mental Status: He is alert and oriented to person, place, and time.    Review of Systems  Constitutional:  Negative for chills, diaphoresis and fever.  HENT:  Negative for congestion and sore throat.   Respiratory:  Negative for cough, shortness of breath and wheezing.   Cardiovascular:  Negative for chest pain and palpitations.  Gastrointestinal:  Negative for abdominal pain, constipation, diarrhea, heartburn, nausea and vomiting.  Musculoskeletal:  Negative for joint pain and myalgias.  Neurological:  Negative for dizziness, tingling, tremors, sensory change, speech change, focal weakness, seizures, loss of consciousness, weakness and headaches.  Psychiatric/Behavioral:  Positive for depression and substance abuse. Negative for hallucinations, memory loss and suicidal ideas. The patient is nervous/anxious and has insomnia.    Blood pressure 128/67, pulse 75, temperature 98.1 F (36.7 C), temperature source Oral, resp. rate 18, height 6' 2 (1.88 m), weight 87.1 kg, SpO2 100%. Body mass index is 24.65 kg/m.  Treatment Plan Summary: Daily contact with patient to assess and evaluate symptoms and progress in treatment  and Medication management.   Principal/active diagnoses.  Bipolar 1 disorder, mixed episodes.  GAD.  Poor impulse control in adult.  Plan: The risks/benefits/side-effects/alternatives to the medications in use were discussed in detail with the patient and time was given for patient's questions. The patient consents to medication trial.   Continue Latuda  20 mg po Q bedtime for mood control.  Continue Depakote  2000 mg po at bedtime for mood stabilization.  Continue trazodone  200 mg po Q hs for insomnia. Continue hydroxyzine  25 mg tid prn for anxiety.   Agitation protocols.  Continue as recommended (See MAR).   Other medical conditions: Continue doxycycline  100 mg po bid for right foot infection Continue nicotine  patch 21 mg trans-dermally Q 24 hours for Nicotine  withdrawal management. Continue prednisone  20 mg po daily with breakfast for inflammation.  Other PRNS -Continue Tylenol  650 mg every 6 hours PRN for mild pain -Continue Maalox 30 ml Q 4 hrs PRN for indigestion -Continue MOM 30 ml po Q 6 hrs for constipation  Safety and Monitoring: Voluntary admission to inpatient psychiatric unit for safety, stabilization and treatment Daily contact with patient to assess and evaluate symptoms and progress in treatment Patient's case to be discussed in multi-disciplinary team meeting Observation Level : q15 minute checks Vital signs: q12 hours Precautions: Safety  Discharge Planning: Social work and case management to assist with discharge planning and identification of hospital follow-up needs prior to discharge Estimated LOS: 5-7 days Discharge Concerns: Need to establish a safety plan; Medication compliance and effectiveness Discharge Goals: Return home with outpatient referrals for mental health follow-up including medication management/psychotherapy  Mark Bolster, NP, pmhnp, fnp-bc. 11/08/2023, 5:00 PM Patient ID: Shann Merrick, adult   DOB: 05-Nov-1990, 33 y.o.   MRN:  [id number]

## 2023-11-08 NOTE — BH Assessment (Signed)
(  Sleep Hours) -7.75 (Any PRNs that were needed, meds refused, or side effects to meds)- no (Any disturbances and when (visitation, over night)- (Concerns raised by the patient)- no (SI/HI/AVH)-denies

## 2023-11-08 NOTE — Progress Notes (Signed)
   11/07/23 2300  Psych Admission Type (Psych Patients Only)  Admission Status Voluntary  Psychosocial Assessment  Patient Complaints None  Eye Contact Fair  Facial Expression Animated  Affect Anxious  Speech Logical/coherent  Interaction Assertive  Motor Activity Slow  Appearance/Hygiene Disheveled;In scrubs  Behavior Characteristics Cooperative;Appropriate to situation  Mood Pleasant  Aggressive Behavior  Effect No apparent injury  Thought Process  Coherency WDL  Content WDL  Delusions None reported or observed  Perception WDL  Hallucination None reported or observed  Judgment WDL  Confusion None  Danger to Self  Current suicidal ideation? Denies  Danger to Others  Danger to Others None reported or observed

## 2023-11-08 NOTE — Plan of Care (Signed)
   Problem: Education: Goal: Knowledge of [facility] Education information/materials will improve Outcome: Progressing Goal: Emotional status will improve Outcome: Progressing Goal: Mental status will improve Outcome: Progressing Goal: Verbalization of understanding the information provided will improve Outcome: Progressing

## 2023-11-08 NOTE — Progress Notes (Deleted)
 BH MD Outpatient Progress Note  11/08/2023 5:23 PM Mark [name]  MRN:  [id number]  Assessment:  Mark [name] presents for follow-up evaluation. Today, 11/08/23, patient reports ***  The risks/benefits/side-effects/alternatives to this medication were discussed in detail with the patient and time was given for questions. The patient consents to medication trial.   Identifying Information: Mark [name] is a 33 y.o. adult with a history of *** who is an established patient with [facility] for management of ***.  Risk Assessment: An assessment of suicide and violence risk factors was performed as part of this evaluation and is not *** significantly changed from the last visit.             While future psychiatric events cannot be accurately predicted, the patient does not *** currently require acute inpatient psychiatric care and does not *** currently meet [place]  involuntary commitment criteria.          Plan:  # PTSD Past medication trials:  Status of problem: *** Interventions: -- continue prozac  20mg  daily  # Intellectual disability # Intermittent Explosive Disorder Past medication trials:  Status of problem: *** Interventions: --continue depakote  ER 2000 mg at bedtime  --04/2023 VPA level <4; 05/2023 VPA level 18  --8/25: depakote  level 93 --hepatic function panel 05/07/23 wnl  # GAD  Past medication trials:  Status of problem: *** Interventions: -- prozac  as above   # Cannabis use with anxiety disorder  Past medication trials:  Status of problem: *** Interventions: -- advise cessation  Return to care in ***  Patient was given contact information for behavioral health clinic and was instructed to call 911 for emergencies.   Patient and plan of care will be discussed with the Attending MD ,Dr. ***, who agrees with the above statement and plan.   Subjective:  Chief Complaint: No chief complaint on file.   Interval  History: *** Labs: CMP, levels, UDS, PDMP  PDMP gabapentin  300mg  90# 30 days, filled 08/11/2023  10/2023 CBC with leukocytosis  10/2023 lipid panel with LDL 115, Vit D/B12 wnl.   03/7972 UDS+THC EKG: 10/2023 Qtc 453, NSR MRI brain / EEG: 10/2023 MRI brain wnl. Sleep study: none Last visit, med changes   -08/2023 with Mark [name] - continue prozac  20mg  daily, continue depakote  ER 2000mg  at bedtime, trazodone  200 at bedtime  Interval notes:  -discharged from [facility] therapy  -went to ED for seizure like activity and they wanted to do inpatient work-up for his seizure including MRI and EEG. MRI brain wnl. EEG not obtained due to patient leaving AMA, wanting outpatient work-up for his seizure like activity.  -11/05/23: patient presented to [facility] after being videotaped by bystander on top of covered walking bridge, actively suicidal. Stressors include conflict with fiance and concern that fiance will leave him. Admitted to [facility] and started on latuda  20mg  at bedtime.   ? History of medication non-adherence - did he increase depakote  to 2000mg  at bedtime?  -has legal guardian  [ ]  sleep [ ]  appetite  [ ]  medication side effects  [ ]  mood  [ ]  stressors  [ ]  substance use - still using THC 2-3x per week  [ ]  safety    Visit Diagnosis: No diagnosis found.  Past Psychiatric History:  Diagnoses: PTSD, intellectual disability, intermittent explosive disorder, GAD, cannabis use with anxiety, Bipolar I disorder mixed Medication trials:   Current: prozac  20mg  daily, depakote  ER 2000mg  at bedtime, trazodone  200 at bedtime  Past: gabapentin , clonidine , clonazepam , sertraline , quetiapine  Previous psychiatrist/therapist: Dr. Lynnette Hospitalizations: 4 psych hosp in middle school for anger and depression  Suicide attempts: SI in middle school of putting knife to neck, immolating self on trampoline, and hanging himself  SIB: denies  Hx of violence towards others: *** Current access to guns: *** Hx of  trauma/abuse: prior concussions as child but unable to recall when  Developmental history: ***  Substance Use History: UDS, PDMP EtOH:  reports no history of alcohol use. Nicotine :  reports that he has been smoking. He does not have any smokeless tobacco history on file. cigarettes THC/CBD: sativia and indica for anxiety 2-3 per week IV drug use: denies Stimulants: denies Opiates: denies Sedative/hypnotics: denies Hallucinogens: denies Seizures: denies  Initial note:  Mark [name] is a 33 y.o. male with PMH of IDD, IED, ADD, bipolar disorder, hypertension, type 2 diabetes mellitus c/b Charcot's foot, dyslipidemia , 3 suicide attempt during adolesence, 4 inpt adolescent psych admission, who presented in person for psychiatric evaluation of anxiety along with legal guardian Mark [name]. Patient lives in a group home and has a caregiver. His psychotropics include depakote  DR 1000 mg bid and trazodone  200 mg at bedtime. It appears he had been previously on Depakote  ER likely for impulse control related to IDD vs ADD. Per patient's history, it seems his symptoms are more consistent with Generalized Anxiety Disorder, PTSD, and IED rather than bipolar disorder. His mood swings do not meet criteria for mania or hypomania. Depakote  is still a good option to aid with impulse control and mood lability but patient does not appear to meet criteria for bipolar disorder. We will start fluoxetine  to address his anxiety and PTSD. Plan to keep remainder of medications the same but will get a depakote  level this visit to evaluate for trough level and hepatic function panel. Patient is also seeking to start psychotherapy so she will see Mark [name] on 05/17/23. Patient to follow up with me in 1 month to evaluate for progress.   Past Medical History:  Past Medical History:  Diagnosis Date   ADHD (attention deficit hyperactivity disorder)    Bipolar 1 disorder (HCC)    Mental retardation     Past  Surgical History:  Procedure Laterality Date   NO PAST SURGERIES     Dx:  has a past medical history of ADHD (attention deficit hyperactivity disorder), Bipolar 1 disorder (HCC), and Mental retardation.  Seizures: denies Allergies: Other   Family Psychiatric History: GAD and MDD   Family History:  Family History  Problem Relation Age of Onset   Hypertension Mother    Hyperlipidemia Mother     Social History:  Living with: 1 roommate, landlady Income: maintenance for [employer] Children: none Support: Child psychotherapist, Arboriculturist, nieces, sister Guns/Weapons: denies Legal: denies Developmental: IDD  Substance Use History:   Social History   Socioeconomic History   Marital status: Single    Spouse name: Not on file   Number of children: Not on file   Years of education: Not on file   Highest education level: Not on file  Occupational History   Not on file  Tobacco Use   Smoking status: Every Day    Current packs/day: 1.00    Average packs/day: 1 pack/day for 0.7 years (0.7 ttl pk-yrs)    Types: Cigarettes    Start date: 2025   Smokeless tobacco: Not on file  Substance and Sexual Activity   Alcohol use: No   Drug use:  Yes    Types: Marijuana   Sexual activity: Yes  Other Topics Concern   Not on file  Social History Narrative   ** Merged History Encounter **       Social Drivers of Health   Financial Resource Strain: Low Risk  (02/20/2023)   Received from [facility]   Overall Financial Resource Strain (CARDIA)    Difficulty of Paying Living Expenses: Not hard at all  Food Insecurity: No Food Insecurity (11/05/2023)   Hunger Vital Sign    Worried About Running Out of Food in the Last Year: Never true    Ran Out of Food in the Last Year: Never true  Transportation Needs: No Transportation Needs (11/05/2023)   PRAPARE - Administrator, Civil Service (Medical): No    Lack of Transportation (Non-Medical): No  Physical Activity: Sufficiently Active  (02/20/2023)   Received from [facility]   Exercise Vital Sign    On average, how many days per week do you engage in moderate to strenuous exercise (like a brisk walk)?: 5 days    On average, how many minutes do you engage in exercise at this level?: 120 min  Stress: Stress Concern Present (02/20/2023)   Received from [facility] - Occupational Stress Questionnaire    Feeling of Stress : To some extent  Social Connections: Somewhat Isolated (02/20/2023)   Received from [facility]   Social Network    How would you rate your social network (family, work, friends)?: Restricted participation with some degree of social isolation    Allergies:  Allergies  Allergen Reactions   Other     Chinese food--used benadryl  *Pt denies allergy    Current Medications: No current facility-administered medications for this visit.   No current outpatient medications on file.   Facility-Administered Medications Ordered in Other Visits  Medication Dose Route Frequency Provider Last Rate Last Admin   acetaminophen  (TYLENOL ) tablet 650 mg  650 mg Oral Q6H PRN Arloa Suzen RAMAN, NP   650 mg at 11/08/23 1515   alum & mag hydroxide-simeth (MAALOX/MYLANTA) 200-200-20 MG/5ML suspension 30 mL  30 mL Oral Q4H PRN Arloa Suzen RAMAN, NP       haloperidol  (HALDOL ) tablet 5 mg  5 mg Oral TID PRN Arloa Suzen RAMAN, NP       And   diphenhydrAMINE  (BENADRYL ) capsule 50 mg  50 mg Oral TID PRN Arloa Suzen RAMAN, NP       haloperidol  lactate (HALDOL ) injection 5 mg  5 mg Intramuscular TID PRN Arloa Suzen RAMAN, NP       And   diphenhydrAMINE  (BENADRYL ) injection 50 mg  50 mg Intramuscular TID PRN Arloa Suzen RAMAN, NP       And   LORazepam  (ATIVAN ) injection 2 mg  2 mg Intramuscular TID PRN Arloa Suzen RAMAN, NP       haloperidol  lactate (HALDOL ) injection 10 mg  10 mg Intramuscular TID PRN Arloa Suzen RAMAN, NP       And   diphenhydrAMINE  (BENADRYL )  injection 50 mg  50 mg Intramuscular TID PRN Arloa Suzen RAMAN, NP       And   LORazepam  (ATIVAN ) injection 2 mg  2 mg Intramuscular TID PRN Arloa Suzen RAMAN, NP       divalproex  (DEPAKOTE  ER) 24 hr tablet 2,000 mg  2,000 mg Oral QHS Arloa Suzen RAMAN, NP   2,000 mg at 11/07/23 2143   doxycycline  (VIBRA -TABS) tablet  100 mg  100 mg Oral BID Arloa Suzen RAMAN, NP   100 mg at 11/08/23 1645   hydrOXYzine  (ATARAX ) tablet 25 mg  25 mg Oral TID PRN Arloa Suzen RAMAN, NP       lurasidone  (LATUDA ) tablet 20 mg  20 mg Oral QHS Ntuen, Tina C, FNP   20 mg at 11/07/23 2143   magnesium  hydroxide (MILK OF MAGNESIA) suspension 30 mL  30 mL Oral Daily PRN Arloa Suzen RAMAN, NP       nicotine  (NICODERM CQ  - dosed in mg/24 hours) patch 14 mg  14 mg Transdermal Daily PRN Arloa Suzen RAMAN, NP   14 mg at 11/06/23 1714   nicotine  polacrilex (NICORETTE ) gum 2 mg  2 mg Oral PRN Parker, Alvin S, MD   2 mg at 11/08/23 1517   predniSONE  (DELTASONE ) tablet 20 mg  20 mg Oral Q breakfast Arloa Suzen RAMAN, NP   20 mg at 11/08/23 9241   traZODone  (DESYREL ) tablet 200 mg  200 mg Oral QHS Arloa Suzen RAMAN, NP   200 mg at 11/07/23 2143    ROS: Review of Systems  Objective:  Psychiatric Specialty Exam: There were no vitals taken for this visit.There is no height or weight on file to calculate BMI.  General Appearance: {Appearance:22683}  Eye Contact:  {BHH EYE CONTACT:22684}  Speech:  {Speech:22685}  Volume:  {Volume (PAA):22686}  Mood:  {BHH MOOD:22306}  Affect:  {Affect (PAA):22687}  Thought Content: {Thought Content:22690}   Suicidal Thoughts:  {ST/HT (PAA):22692}  Homicidal Thoughts:  {ST/HT (PAA):22692}  Thought Process:  {Thought Process (PAA):22688}  Orientation:  {BHH ORIENTATION (PAA):22689}    Memory: Grossly intact ***  Judgment:  {Judgement (PAA):22694}  Insight:  {Insight (PAA):22695}  Concentration:  {Concentration:21399}  Recall: not formally assessed ***  Fund of Knowledge: {BHH  GOOD/FAIR/POOR:22877}  Language: {BHH GOOD/FAIR/POOR:22877}  Psychomotor Activity:  {Psychomotor (PAA):22696}  Akathisia:  {BHH YES OR NO:22294}  AIMS (if indicated): {Desc; done/not:10129}  Assets:  {Assets (PAA):22698}  ADL's:  {BHH JIO'D:77709}  Cognition: {chl bhh cognition:304700322}  Sleep:  {BHH GOOD/FAIR/POOR:22877}   PE: General: well-appearing; no acute distress *** Pulm: no increased work of breathing on room air *** Strength & Muscle Tone: {desc; muscle tone:32375} Neuro: no focal neurological deficits observed *** Gait & Station: {PE GAIT ED WJUO:77474}  Metabolic Disorder Labs: Lab Results  Component Value Date   HGBA1C 5.2 11/05/2023   MPG 102.54 11/05/2023   MPG 154 09/04/2014   No results found for: PROLACTIN Lab Results  Component Value Date   CHOL 185 11/06/2023   TRIG 148 11/06/2023   HDL 40 (L) 11/06/2023   CHOLHDL 4.6 11/06/2023   VLDL 30 11/06/2023   LDLCALC 115 (H) 11/06/2023   LDLCALC 77 11/05/2023   Lab Results  Component Value Date   TSH 0.691 11/05/2023   TSH 0.713 09/03/2014    Therapeutic Level Labs: No results found for: LITHIUM Lab Results  Component Value Date   VALPROATE 93 11/05/2023   VALPROATE 16 (L) 11/02/2023   No results found for: CBMZ  Screenings:  AUDIT    Flowsheet Row [facility]  Alcohol Use Disorder Identification Test Final Score (AUDIT) 0   Flowsheet Row Ad[facility] (Current[facility] Most recent reading at 11/05/2023  9:00 PM ED from 11/05/2023 in [facility] Most recent reading at 11/05/2023 12:51 PM ED from 11/02/2023 in [facility] at [facility] Most recent reading at 11/02/2023  2:34 PM  C-SSRS RISK CATEGORY High Risk High Risk No Risk    Collaboration of Care: Collaboration of Care: [facility] OP Collaboration of  Care:[id number]}  Patient/Guardian was advised Release of Information must be obtained prior to any record release in order to collaborate their care with an outside provider. Patient/Guardian was advised if they have not already done so to contact the registration department to sign all necessary forms in order for us  to release information regarding their care.   Consent: Patient/Guardian gives verbal consent for treatment and assignment of benefits for services provided during this visit. Patient/Guardian expressed understanding and agreed to proceed.   Corean Minor, MD, PGY-3 11/08/2023, 5:23 PM

## 2023-11-08 NOTE — Plan of Care (Signed)

## 2023-11-08 NOTE — Group Note (Signed)
 LCSW Group Therapy Note   Group Date: 11/08/2023 Start Time: 1100 End Time: 1200   Participation:  patient was present and actively participated in the discussion  Type of Therapy:  Group Therapy  Topic:  Healing From Within: Understanding Our Past, Building Our Future"  Objective:  To help participants understand the impact of early experiences on mental and physical health, with a focus on Adverse Childhood Experiences (ACEs), and to explore ways to build resilience and healing.  Group Goals: Understand ACEs and Their Impact: Learn how childhood experiences shape mental and physical health. Build Resilience: Develop strategies for overcoming challenges and creating positive change. Promote Healing: Recognize the value of support and the possibility of healing through therapy and self-care.  Summary: In today's session, we discussed how early experiences, especially ACEs, impact mental and physical health. We explored the effects of stress, abuse, and neglect on brain development and well-being. The group focused on resilience, understanding that healing and positive change are possible with support and self-awareness.  Therapeutic Modalities Used: Psychoeducation: Sharing information about ACEs and their effects. Cognitive Behavioral Therapy (CBT): Helping reframe negative thought patterns. Trauma-Informed Therapy: Creating a safe, supportive space for healing.   Mark [name] Mark [name], LCSWA 11/08/2023  6:32 PM

## 2023-11-08 NOTE — Group Note (Signed)
 Date:  11/08/2023 Time:  4:41 PM  Group Topic/Focus:  Medication side effect    Participation Level:  Active  Participation Quality:  Appropriate and Attentive  Affect:  Appropriate  Cognitive:  Lacking  Insight: Lacking  Engagement in Group:  Monopolizing  Modes of Intervention:  Discussion and Education   Mark [name] 11/08/2023, 4:41 PM

## 2023-11-09 DIAGNOSIS — F3160 Bipolar disorder, current episode mixed, unspecified: Principal | ICD-10-CM

## 2023-11-09 NOTE — Progress Notes (Signed)
   11/09/23 0845  Psych Admission Type (Psych Patients Only)  Admission Status Voluntary  Psychosocial Assessment  Patient Complaints None  Eye Contact Fair  Facial Expression Other (Comment) (Appropriate)  Affect Anxious  Speech Logical/coherent  Interaction Assertive  Motor Activity Other (Comment) (WDL)  Appearance/Hygiene Unremarkable  Behavior Characteristics Cooperative;Appropriate to situation  Mood Pleasant  Thought Process  Coherency WDL  Content WDL  Delusions None reported or observed  Perception WDL  Hallucination None reported or observed  Judgment Impaired  Confusion None  Danger to Self  Current suicidal ideation? Denies  Agreement Not to Harm Self Yes  Description of Agreement Verbal  Danger to Others  Danger to Others None reported or observed

## 2023-11-09 NOTE — Group Note (Signed)
 Date:  11/09/2023 Time:  5:31 PM  Group Topic/Focus:  Recovery Goals:   The focus of this group is to identify appropriate goals for recovery and establish a plan to achieve them.    Mark [name] 11/09/2023, 5:31 PM

## 2023-11-09 NOTE — Group Note (Signed)
 Date:  11/09/2023 Time:  9:40 PM  Group Topic/Focus:  Wrap-Up Group:   The focus of this group is to help patients review their daily goal of treatment and discuss progress on daily workbooks.    Participation Level:  Active  Participation Quality:  Monopolizing  Affect:  Appropriate  Cognitive:  Appropriate  Insight: Improving  Engagement in Group:  Engaged and Monopolizing  Modes of Intervention:  Discussion  Additional Comments:  Pt attended the evening wrap-up group. Tech introduced the staff for the evening, reminded group of the evening schedule and reminded them to ask for anything they need. Pt discussed their goal post discharge and their definition of resilience. Pt shared how he has had to be resilient in his life.  [place] JINNY Bend 11/09/2023, 9:40 PM

## 2023-11-09 NOTE — Progress Notes (Signed)
 [facility] MD Progress Note  11/09/2023 4:11 PM Mark [name]  MRN:  [id number]  Subjective: Reason for admission: 33 year old Caucasian male with prior hx of IDD, Bipolar disorder, ADHD, PTSD, poor impulse control, Cannabis use disorder & MDD.  Patient presents voluntarily to the [facility] from [facility] with worsening depression triggering in suicidal ideation with plan to jump off a bridge. Symptoms triggered by psychosocial stressors & noncompliant to treatment regimen. A review of the current lab results has shown his  BAL <15, UDS positive for THC.   Daily notes: Mark [name] is seen this morning. Chart reviewed. The chart finding discussed with the treatment team this afternoon. Patient presents alert, oriented & aware of situation. He remains visible on the unit, attending group sessions. He reports, I think my mood is coming along fine. I'm almost feeling like myself again. I feel a lot calmer within me. I think the medicines has been helpful. I think I also benefited from the group sessions. I would like to know my definite date for discharge so I can tell my fiance. Mark [name] currently denies any SIHI, AVH, delusional thoughts or paranoia. He does not appear to be responding to any internal stimuli. Patient is stable enough for discharge tomorrow 12-11-23. Continue current plan of care as already in progress.  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   Past Medical History:  Past Medical History:  Diagnosis Date   ADHD (attention deficit hyperactivity disorder)    Bipolar 1  disorder (HCC)    Mental retardation     Past Surgical History:  Procedure Laterality Date   NO PAST SURGERIES     Family History:  Family History  Problem Relation Age of Onset   Hypertension Mother    Hyperlipidemia Mother    Family Psychiatric  History: See H&P.  Social History:  Social History   Substance and Sexual Activity  Alcohol Use No     Social History   Substance and Sexual Activity  Drug Use Yes   Types: Marijuana    Social History   Socioeconomic History   Marital status: Single    Spouse name: Not on file   Number of children: Not on file   Years of education: Not on file   Highest education level: Not on file  Occupational History   Not on file  Tobacco Use   Smoking status: Every Day    Current packs/day: 1.00    Average packs/day: 1 pack/day for 0.7 years (0.7 ttl pk-yrs)    Types: Cigarettes    Start date: 2025   Smokeless tobacco: Not on file  Substance and Sexual Activity   Alcohol use: No   Drug use: Yes    Types: Marijuana   Sexual activity: Yes  Other Topics Concern   Not on file  Social History Narrative   ** Merged History Encounter **       Social Drivers of Health   Financial Resource Strain: Low Risk  (02/20/2023)   Received from [facility]   Overall Financial Resource Strain (CARDIA)    Difficulty of  Paying Living Expenses: Not hard at all  Food Insecurity: No Food Insecurity (11/05/2023)   Hunger Vital Sign    Worried About Running Out of Food in the Last Year: Never true    Ran Out of Food in the Last Year: Never true  Transportation Needs: No Transportation Needs (11/05/2023)   PRAPARE - Administrator, Civil Service (Medical): No    Lack of Transportation (Non-Medical): No  Physical Activity: Sufficiently Active (02/20/2023)   Received from [facility]   Exercise Vital Sign    On average, how many days per week do you engage in moderate to strenuous exercise (like a brisk walk)?: 5 days    On  average, how many minutes do you engage in exercise at this level?: 120 min  Stress: Stress Concern Present (02/20/2023)   Received from [facility] - Occupational Stress Questionnaire    Feeling of Stress : To some extent  Social Connections: Somewhat Isolated (02/20/2023)   Received from [facility]   Social Network    How would you rate your social network (family, work, friends)?: Restricted participation with some degree of social isolation   Additional Social History:   Sleep: Good Estimated Sleeping Duration (Last 24 Hours): 7.00-7.25 hours  Appetite:  Good  Current Medications: Current Facility-Administered Medications  Medication Dose Route Frequency Provider Last Rate Last Admin   acetaminophen  (TYLENOL ) tablet 650 mg  650 mg Oral Q6H PRN Arloa Suzen RAMAN, NP   650 mg at 11/08/23 2119   alum & mag hydroxide-simeth (MAALOX/MYLANTA) 200-200-20 MG/5ML suspension 30 mL  30 mL Oral Q4H PRN Arloa Suzen RAMAN, NP       haloperidol  (HALDOL ) tablet 5 mg  5 mg Oral TID PRN Arloa Suzen RAMAN, NP       And   diphenhydrAMINE  (BENADRYL ) capsule 50 mg  50 mg Oral TID PRN Arloa Suzen RAMAN, NP       haloperidol  lactate (HALDOL ) injection 5 mg  5 mg Intramuscular TID PRN Arloa Suzen RAMAN, NP       And   diphenhydrAMINE  (BENADRYL ) injection 50 mg  50 mg Intramuscular TID PRN Arloa Suzen RAMAN, NP       And   LORazepam  (ATIVAN ) injection 2 mg  2 mg Intramuscular TID PRN Arloa Suzen RAMAN, NP       haloperidol  lactate (HALDOL ) injection 10 mg  10 mg Intramuscular TID PRN Arloa Suzen RAMAN, NP       And   diphenhydrAMINE  (BENADRYL ) injection 50 mg  50 mg Intramuscular TID PRN Arloa Suzen RAMAN, NP       And   LORazepam  (ATIVAN ) injection 2 mg  2 mg Intramuscular TID PRN Arloa Suzen RAMAN, NP       divalproex  (DEPAKOTE  ER) 24 hr tablet 2,000 mg  2,000 mg Oral QHS Arloa Suzen RAMAN, NP   2,000 mg at 11/08/23 2119   doxycycline   (VIBRA -TABS) tablet 100 mg  100 mg Oral BID Arloa Suzen RAMAN, NP   100 mg at 11/09/23 9156   hydrOXYzine  (ATARAX ) tablet 25 mg  25 mg Oral TID PRN Arloa Suzen RAMAN, NP       lurasidone  (LATUDA ) tablet 20 mg  20 mg Oral QHS Ntuen, Tina C, FNP   20 mg at 11/08/23 2119   magnesium  hydroxide (MILK OF MAGNESIA) suspension 30 mL  30 mL Oral Daily PRN Arloa Suzen RAMAN, NP       nicotine  (NICODERM CQ  -  dosed in mg/24 hours) patch 14 mg  14 mg Transdermal Daily PRN Arloa Suzen RAMAN, NP   14 mg at 11/06/23 1714   nicotine  polacrilex (NICORETTE ) gum 2 mg  2 mg Oral PRN Parker, Alvin S, MD   2 mg at 11/09/23 1241   traZODone  (DESYREL ) tablet 200 mg  200 mg Oral QHS Arloa Suzen RAMAN, NP   200 mg at 11/08/23 2119   Lab Results:  No results found for this or any previous visit (from the past 48 hours).  Blood Alcohol level:  Lab Results  Component Value Date   [facility] <15 11/02/2023   ETH <5 09/03/2014   Metabolic Disorder Labs: Lab Results  Component Value Date   HGBA1C 5.2 11/05/2023   MPG 102.54 11/05/2023   MPG 154 09/04/2014   No results found for: PROLACTIN Lab Results  Component Value Date   CHOL 185 11/06/2023   TRIG 148 11/06/2023   HDL 40 (L) 11/06/2023   CHOLHDL 4.6 11/06/2023   VLDL 30 11/06/2023   LDLCALC 115 (H) 11/06/2023   LDLCALC 77 11/05/2023   Physical Findings: AIMS:  ,  ,  ,  ,  ,  ,   CIWA:    COWS:     Musculoskeletal: Strength & Muscle Tone: within normal limits Gait & Station: normal Patient leans: N/A  Psychiatric Specialty Exam:  Presentation  General Appearance:  Appropriate for Environment; Casual  Eye Contact: Good  Speech: Clear and Coherent; Normal Rate  Speech Volume: Normal  Handedness: Right  Mood and Affect  Mood: -- (Improving.)  Affect: Congruent  Thought Process  Thought Processes: Coherent; Linear  Descriptions of Associations:Intact  Orientation:Full (Time, Place and Person)  Thought  Content:Logical  History of Schizophrenia/Schizoaffective disorder:No  Duration of Psychotic Symptoms:No data recorded Hallucinations:Hallucinations: None  Ideas of Reference:None  Suicidal Thoughts:Suicidal Thoughts: No  Homicidal Thoughts:Homicidal Thoughts: No  Sensorium  Memory: Immediate Good; Recent Good; Remote Good  Judgment: Good  Insight: Good  Executive Functions  Concentration: Good  Attention Span: Good  Recall: Good  Fund of Knowledge: Fair  Language: Good  Psychomotor Activity  Psychomotor Activity:Psychomotor Activity: Normal  Assets  Assets: Communication Skills; Desire for Improvement; Housing; Social Support  Sleep  Sleep:Sleep: Good Number of Hours of Sleep: 8  Physical Exam: Physical Exam Vitals and nursing note reviewed.  HENT:     Head: Normocephalic.     Nose: Nose normal.  Cardiovascular:     Rate and Rhythm: Normal rate.     Pulses: Normal pulses.  Genitourinary:    Comments: Deferred. Musculoskeletal:        General: Normal range of motion.     Cervical back: Normal range of motion.  Skin:    General: Skin is dry.  Neurological:     General: No focal deficit present.     Mental Status: He is alert and oriented to person, place, and time.    Review of Systems  Constitutional:  Negative for chills, diaphoresis and fever.  HENT:  Negative for congestion and sore throat.   Respiratory:  Negative for cough, shortness of breath and wheezing.   Cardiovascular:  Negative for chest pain and palpitations.  Gastrointestinal:  Negative for abdominal pain, constipation, diarrhea, heartburn, nausea and vomiting.  Musculoskeletal:  Negative for joint pain and myalgias.  Neurological:  Negative for dizziness, tingling, tremors, sensory change, speech change, focal weakness, seizures, loss of consciousness, weakness and headaches.  Psychiatric/Behavioral:  Positive for depression and substance abuse. Negative for  hallucinations,  memory loss and suicidal ideas. The patient is nervous/anxious and has insomnia.    Blood pressure 117/64, pulse 68, temperature 97.8 F (36.6 C), temperature source Oral, resp. rate 18, height 6' 2 (1.88 m), weight 87.1 kg, SpO2 97%. Body mass index is 24.65 kg/m.  Treatment Plan Summary: Daily contact with patient to assess and evaluate symptoms and progress in treatment and Medication management.   Principal/active diagnoses.  Bipolar 1 disorder, mixed episodes.  GAD.  Poor impulse control in adult.  Plan: The risks/benefits/side-effects/alternatives to the medications in use were discussed in detail with the patient and time was given for patient's questions. The patient consents to medication trial.   Continue Latuda  20 mg po Q bedtime for mood control.  Continue Depakote  2000 mg po at bedtime for mood stabilization.  Continue trazodone  200 mg po Q hs for insomnia. Continue hydroxyzine  25 mg tid prn for anxiety.   Agitation protocols.  Continue as recommended (See MAR).   Other medical conditions: Continue doxycycline  100 mg po bid for right foot infection Continue nicotine  patch 21 mg trans-dermally Q 24 hours for Nicotine  withdrawal management. Continue prednisone  20 mg po daily with breakfast for inflammation.  Other PRNS -Continue Tylenol  650 mg every 6 hours PRN for mild pain -Continue Maalox 30 ml Q 4 hrs PRN for indigestion -Continue MOM 30 ml po Q 6 hrs for constipation  Safety and Monitoring: Voluntary admission to inpatient psychiatric unit for safety, stabilization and treatment Daily contact with patient to assess and evaluate symptoms and progress in treatment Patient's case to be discussed in multi-disciplinary team meeting Observation Level : q15 minute checks Vital signs: q12 hours Precautions: Safety  Discharge Planning: Social work and case management to assist with discharge planning and identification of hospital follow-up needs prior to  discharge Estimated LOS: 5-7 days Discharge Concerns: Need to establish a safety plan; Medication compliance and effectiveness Discharge Goals: Return home with outpatient referrals for mental health follow-up including medication management/psychotherapy  Mac Bolster, NP, pmhnp, fnp-bc. 11/09/2023, 4:11 PM Patient ID: Mark [name], adult   DOB: 17-May-1990, 33 y.o.   MRN: [id number] Patient ID: Mark [name], adult   DOB: Sep 23, 1990, 33 y.o.   MRN: [id number]

## 2023-11-09 NOTE — Group Note (Signed)
 Recreation Therapy Group Note   Group Topic:Problem Solving  Group Date: 11/09/2023 Start Time: 0935 End Time: 1003 Facilitators: Joella Saefong-McCall, LRT,CTRS Location: [address] Dayroom   Group Topic: Communication, Team Building, Problem Solving  Goal Area(s) Addresses:  Patient will effectively work with peer towards shared goal.  Patient will identify skills used to make activity successful.  Patient will identify how skills used during activity can be used to reach post d/c goals.   Behavioral Response: Moderate  Intervention: STEM Activity  Activity: Straw Bridge. In teams of 3-5, patients were given 15 plastic drinking straws and an equal length of masking tape. Using the materials provided, patients were instructed to build a free standing bridge-like structure to suspend an everyday item (ex: puzzle box) off of the floor or table surface. All materials were required to be used by the team in their design. LRT facilitated post-activity discussion reviewing team process. Patients were encouraged to reflect how the skills used in this activity can be generalized to daily life post discharge.   Education: Pharmacist, community, Scientist, physiological, Discharge Planning   Education Outcome: Acknowledges education/In group clarification offered/Needs additional education.    Affect/Mood: Appropriate   Participation Level: Moderate   Participation Quality: Independent   Behavior: Appropriate   Speech/Thought Process: Relevant   Insight: Good   Judgement: Good   Modes of Intervention: STEM Activity   Patient Response to Interventions:  Attentive   Education Outcome:  In group clarification offered    Clinical Observations/Individualized Feedback: Pt didn't help peers put their bridge together. Pt did offer some suggestions but for the most part, pt was having general conversation with peers.     Plan: Continue to engage patient in RT group sessions 2-3x/week.   Tamaka Sawin-McCall, LRT,CTRS 11/09/2023 12:24 PM

## 2023-11-09 NOTE — Progress Notes (Signed)
(  Sleep Hours) - 7.25 (Any PRNs that were needed, meds refused, or side effects to meds)- Tylenol , nicorette  gum (Any disturbances and when (visitation, over night)-Pt irritable twice during shift but was easily redirected (Concerns raised by the patient)- Pt though it was communicated to his GF that he had a visitor during visitation time.  Issue was resolved. (SI/HI/AVH)- Denied

## 2023-11-09 NOTE — Group Note (Signed)
 Date:  11/09/2023 Time:  10:12 AM  Group Topic/Focus:  Goals Group:   The focus of this group is to help patients establish daily goals to achieve during treatment and discuss how the patient can incorporate goal setting into their daily lives to aide in recovery.    Participation Level:  Active  Participation Quality:  Appropriate  Affect:  Appropriate  Cognitive:  Appropriate  Insight: Appropriate  Engagement in Group:  Engaged  Modes of Intervention:  Discussion  Additional Comments:  N/A  Candance Bohlman A Ramelo Oetken 11/09/2023, 10:12 AM

## 2023-11-09 NOTE — Plan of Care (Signed)
  Problem: Education: Goal: Verbalization of understanding the information provided will improve Outcome: Progressing   Problem: Activity: Goal: Interest or engagement in activities will improve Outcome: Progressing   Problem: Coping: Goal: Ability to verbalize frustrations and anger appropriately will improve Outcome: Progressing   

## 2023-11-10 DIAGNOSIS — F4310 Post-traumatic stress disorder, unspecified: Secondary | ICD-10-CM

## 2023-11-10 DIAGNOSIS — F639 Impulse disorder, unspecified: Secondary | ICD-10-CM

## 2023-11-10 DIAGNOSIS — F411 Generalized anxiety disorder: Secondary | ICD-10-CM

## 2023-11-10 MED ORDER — LURASIDONE HCL 20 MG PO TABS: 20.0000 mg | ORAL_TABLET | Freq: Every day | ORAL | 0 refills | Status: DC

## 2023-11-10 MED ORDER — DIVALPROEX SODIUM ER 500 MG PO TB24: 2000.0000 mg | ORAL_TABLET | Freq: Every day | ORAL | 0 refills | Status: DC

## 2023-11-10 MED ORDER — DOXYCYCLINE HYCLATE 100 MG PO TABS: 100.0000 mg | ORAL_TABLET | Freq: Two times a day (BID) | ORAL | 0 refills | Status: DC

## 2023-11-10 MED ORDER — TRAZODONE HCL 100 MG PO TABS: 200.0000 mg | ORAL_TABLET | Freq: Every day | ORAL | 0 refills | Status: DC
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
## 2023-11-10 NOTE — Progress Notes (Signed)
  [facility] Adult Case Management Discharge Plan :  Will you be returning to the same living situation after discharge:  Yes,  patient will be returning home.  At discharge, do you have transportation home?: No. CSW will assist with transport. Do you have the ability to pay for your medications: Yes,  patient has insurance.  Release of information consent forms completed and in the chart;  Patient's signature needed at discharge.  Patient to Follow up at:  Follow-up Information     [facility]. Go on 11/14/2023.   Specialty: Behavioral Health Why: You have an appointment on 11/14/23 at 11:00 am for medication management services, in person. Contact information: [address] [place]  [address] [phone]        [facility] Follow up on 11/15/2023.   Why: You have a peer support appointment on 11/15/23 at 3:00 pm, in person.  * PATIENT AND GUARDIAN MUST ATTEND.  Peer support will help you get set up with a new therapist. Contact information: [address] [place] [address] [phone]                 Next level of care provider has access to [facility] Link:no  Safety Planning and Suicide Prevention discussed: Yes,  SPE completed with  Elinda Margo (significant other) [phone].   Has patient been referred to the Quitline?: Patient refused referral for treatment  Patient has been referred for addiction treatment: Patient refused referral for treatment.  [address], LCSWA 11/10/2023, 10:02 AM

## 2023-11-10 NOTE — Progress Notes (Signed)
 Shift Note  (Sleep Hours) - 7.5  (Any PRNs that were needed, meds refused, or side effects to meds)- PO PRN Trazodone  given  (Any disturbances and when (visitation, over night)-None  (Concerns raised by the patient)- None  (SI/HI/AVH)- Denies

## 2023-11-10 NOTE — Plan of Care (Signed)

## 2023-11-10 NOTE — Progress Notes (Signed)
 Patient being discharged to home via taxi. Patient denies SI/HI/AVH. Patient given and educated on all required discharge paperwork and verbalized understanding.  Patient educated on discharge medications and follow up appointments and verbalized understanding. Patients Safety plan completed by patient. Patients belongings returned and patient escorted to ride.

## 2023-11-10 NOTE — BHH Suicide Risk Assessment (Signed)
 [facility] Discharge Suicide Risk Assessment   Principal Problem: Bipolar I disorder, most recent episode mixed [facility])  Discharge Diagnoses: Principal Problem:   Bipolar I disorder, most recent episode mixed (HCC) Active Problems:   Impulse control disorder in adult   GAD (generalized anxiety disorder)   PTSD (post-traumatic stress disorder)   Cannabis use with anxiety disorder (HCC)   MDD (major depressive disorder), recurrent episode, severe (HCC)      Total Time spent with patient: 40  Musculoskeletal: Strength & Muscle Tone: within normal limits Gait & Station: normal Patient leans: N/A   Psychiatric Specialty Exam:  Presentation  General Appearance: Appropriate for Environment  Eye Contact: Good  Speech: Clear and Coherent; Normal Rate  Speech Volume: Normal  Handedness: Right   Mood and Affect  Mood: Euthymic  Affect: Congruent   Thought Process  Thought Processes: Linear  Descriptions of Associations: Intact  Orientation: Full (Time, Place and Person)  Thought Content: Logical  History of Schizophrenia/Schizoaffective disorder: No  Duration of Psychotic Symptoms: NA Hallucinations: Hallucinations: None  Ideas of Reference: None  Suicidal Thoughts: Suicidal Thoughts: No  Homicidal Thoughts: Homicidal Thoughts: No   Sensorium  Memory: Immediate Good  Judgment: Fair  Insight: Fair   Art therapist  Concentration: Good  Attention Span: Good  Recall: Good  Fund of Knowledge: Good  Language: Good   Psychomotor Activity  Psychomotor Activity: Psychomotor Activity: Normal   Assets  Assets: Communication Skills; Social Support; Housing; Leisure Time   Sleep  Sleep: Sleep: Good Number of Hours of Sleep: 8   Physical Exam: General: Sitting comfortably. NAD. HEENT: Normocephalic, atraumatic, MMM, EMOI Lungs: no increased work of breathing noted Heart: no cyanosis Abdomen: Non distended Musculoskeletal: FROM. No obvious  deformities Skin: Warm, dry, intact. No rashes noted Neuro: No obvious focal deficits.  Gait and station are normal  Review of Systems  Constitutional: Negative.   HENT: Negative.    Eyes: Negative.   Respiratory: Negative.    Cardiovascular: Negative.   Gastrointestinal: Negative.   Genitourinary: Negative.   Skin: Negative.   Neurological: Negative.   Psychiatric/Behavioral:  Negative   Mental Status Per Nursing Assessment: NA  Demographic Factors:  Male  Loss Factors: NA  Historical Factors: Prior suicide attempts and Impulsivity  Risk Reduction Factors:   Religious beliefs about death, Living with another person, especially a relative, Positive social support, and Positive therapeutic relationship  Continued Clinical Symptoms:  Previous psychiatric diagnoses and treatments  Cognitive Features That Contribute To Risk:  None  Suicide Risk:  Minimal: No identifiable suicidal ideation.  Patients presenting with no risk factors but with morbid ruminations; may be classified as minimal risk based on the severity of the depressive symptoms.    Follow-up Information     [facility]. Go on 11/14/2023.   Specialty: Behavioral Health Why: You have an appointment on 11/14/23 at 11:00 am for medication management services, in person. Contact information: [address] [place]  [address] [phone]        [facility] Follow up on 11/15/2023.   Why: You have a peer support appointment on 11/15/23 at 3:00 pm, in person.  * PATIENT AND GUARDIAN MUST ATTEND.  Peer support will help you get set up with a new therapist. Contact information: [address] [place] [address] [phone]                  Plan Of Care/Follow-up recommendations:  Activity:  as tolerated  Diet: heart healthy  Other: -Follow-up with your outpatient psychiatric provider -instructions on appointment  date, time, and address (location) are provided to you in discharge paperwork.  -Take your psychiatric medications as prescribed at discharge - instructions are provided to you in the discharge paperwork  -Follow-up with outpatient primary care doctor and other specialists -for management of preventative medicine and chronic medical issues  -Testing: Follow-up with outpatient provider for any abnormal lab results (if any)  -If you are prescribed an atypical antipsychotic medication, we recommend that your outpatient psychiatrist follow routine screening for side effects within 3 months of discharge, including monitoring: AIMS scale, height, weight, blood pressure, fasting lipid panel, HbA1c, and fasting blood sugar.   -Recommend total abstinence from alcohol, tobacco, and other illicit drug use at discharge.   -If your psychiatric symptoms recur, worsen, or if you have side effects to your psychiatric medications, call your outpatient psychiatric provider, 911, [phone] or go to the nearest emergency department.  -If suicidal thoughts occur, immediately call your outpatient psychiatric provider, 911, 988 or go to the nearest emergency department.   Glendia Kitty, MD 11/10/23 10:11 AM

## 2023-11-10 NOTE — Group Note (Signed)
 Date:  11/10/2023 Time:  9:31 AM  Group Topic/Focus:  Goals Group:   The focus of this group is to help patients establish daily goals to achieve during treatment and discuss how the patient can incorporate goal setting into their daily lives to aide in recovery.    Participation Level:  Active  Participation Quality:  Appropriate  Affect:  Appropriate  Cognitive:  Appropriate  Insight: Appropriate  Engagement in Group:  Improving  Modes of Intervention:  Discussion   Mark [name] Mark [name] 11/10/2023, 9:31 AM

## 2023-11-12 ENCOUNTER — Encounter (HOSPITAL_COMMUNITY): Payer: Self-pay

## 2023-11-12 ENCOUNTER — Other Ambulatory Visit: Payer: Self-pay

## 2023-11-12 ENCOUNTER — Ambulatory Visit (HOSPITAL_COMMUNITY)
Admission: EM | Admit: 2023-11-12 | Discharge: 2023-11-13 | Disposition: A | Discharge status: Other Acute Inpatient Hospital | Attending: Psychiatry | Admitting: Psychiatry

## 2023-11-12 ENCOUNTER — Emergency Department (HOSPITAL_COMMUNITY)
Admission: EM | Admit: 2023-11-12 | Discharge: 2023-11-12 | Disposition: A | Discharge status: Home / Self Care | Source: Home / Self Care | Attending: Emergency Medicine | Admitting: Emergency Medicine

## 2023-11-12 DIAGNOSIS — Z2914 Encounter for prophylactic rabies immune globin: Secondary | ICD-10-CM | POA: Insufficient documentation

## 2023-11-12 DIAGNOSIS — R45851 Suicidal ideations: Secondary | ICD-10-CM | POA: Insufficient documentation

## 2023-11-12 DIAGNOSIS — S61451A Open bite of right hand, initial encounter: Secondary | ICD-10-CM | POA: Insufficient documentation

## 2023-11-12 DIAGNOSIS — F319 Bipolar disorder, unspecified: Secondary | ICD-10-CM | POA: Diagnosis not present

## 2023-11-12 DIAGNOSIS — W540XXA Bitten by dog, initial encounter: Secondary | ICD-10-CM | POA: Insufficient documentation

## 2023-11-12 DIAGNOSIS — R4589 Other symptoms and signs involving emotional state: Secondary | ICD-10-CM | POA: Insufficient documentation

## 2023-11-12 DIAGNOSIS — Z203 Contact with and (suspected) exposure to rabies: Secondary | ICD-10-CM | POA: Insufficient documentation

## 2023-11-12 DIAGNOSIS — Z7389 Other problems related to life management difficulty: Secondary | ICD-10-CM | POA: Insufficient documentation

## 2023-11-12 DIAGNOSIS — Z23 Encounter for immunization: Secondary | ICD-10-CM | POA: Insufficient documentation

## 2023-11-12 DIAGNOSIS — S41151A Open bite of right upper arm, initial encounter: Secondary | ICD-10-CM | POA: Insufficient documentation

## 2023-11-12 DIAGNOSIS — F3163 Bipolar disorder, current episode mixed, severe, without psychotic features: Secondary | ICD-10-CM | POA: Diagnosis not present

## 2023-11-12 LAB — CBC WITH DIFFERENTIAL/PLATELET
Abs Immature Granulocytes: 0.35 K/uL — ABNORMAL HIGH (ref 0.00–0.07)
Basophils Absolute: 0.1 K/uL (ref 0.0–0.1)
Basophils Relative: 1 %
Eosinophils Absolute: 0.2 K/uL (ref 0.0–0.5)
Eosinophils Relative: 1 %
HCT: 47 % (ref 39.0–52.0)
Hemoglobin: 15.5 g/dL (ref 13.0–17.0)
Immature Granulocytes: 2 %
Lymphocytes Relative: 16 %
Lymphs Abs: 3.3 K/uL (ref 0.7–4.0)
MCH: 29.9 pg (ref 26.0–34.0)
MCHC: 33 g/dL (ref 30.0–36.0)
MCV: 90.7 fL (ref 80.0–100.0)
Monocytes Absolute: 1.8 K/uL — ABNORMAL HIGH (ref 0.1–1.0)
Monocytes Relative: 9 %
Neutro Abs: 14.4 K/uL — ABNORMAL HIGH (ref 1.7–7.7)
Neutrophils Relative %: 71 %
Platelets: 359 K/uL (ref 150–400)
RBC: 5.18 MIL/uL (ref 4.22–5.81)
RDW: 12.5 % (ref 11.5–15.5)
WBC: 20.2 K/uL — ABNORMAL HIGH (ref 4.0–10.5)
nRBC: 0 % (ref 0.0–0.2)

## 2023-11-12 LAB — POCT URINE DRUG SCREEN - MANUAL ENTRY (I-SCREEN)
POC Amphetamine UR: NOT DETECTED
POC Buprenorphine (BUP): NOT DETECTED
POC Cocaine UR: NOT DETECTED
POC Marijuana UR: POSITIVE — AB
POC Methadone UR: NOT DETECTED
POC Methamphetamine UR: NOT DETECTED
POC Morphine: NOT DETECTED
POC Oxazepam (BZO): NOT DETECTED
POC Oxycodone UR: NOT DETECTED
POC Secobarbital (BAR): NOT DETECTED

## 2023-11-12 MED ORDER — AMOXICILLIN-POT CLAVULANATE 875-125 MG PO TABS: 1.0000 | ORAL_TABLET | Freq: Two times a day (BID) | ORAL | 0 refills | Status: DC

## 2023-11-12 MED ORDER — OLANZAPINE 10 MG IM SOLR: 5.0000 mg | Freq: Three times a day (TID) | INTRAMUSCULAR | Status: DC | PRN

## 2023-11-12 MED ORDER — RABIES VACCINE, PCEC IM SUSR
1.0000 mL | Freq: Once | INTRAMUSCULAR | Status: AC
  Administered 2023-11-12: 1 mL via INTRAMUSCULAR
  Filled 2023-11-12: qty 1

## 2023-11-12 MED ORDER — ALUM & MAG HYDROXIDE-SIMETH 200-200-20 MG/5ML PO SUSP: 30.0000 mL | ORAL | Status: DC | PRN

## 2023-11-12 MED ORDER — DIPHENHYDRAMINE HCL 50 MG PO CAPS: 50.0000 mg | ORAL_CAPSULE | Freq: Three times a day (TID) | ORAL | Status: DC | PRN

## 2023-11-12 MED ORDER — TETANUS-DIPHTH-ACELL PERTUSSIS 5-2.5-18.5 LF-MCG/0.5 IM SUSY
0.5000 mL | PREFILLED_SYRINGE | Freq: Once | INTRAMUSCULAR | Status: AC
  Administered 2023-11-12: 0.5 mL via INTRAMUSCULAR
  Filled 2023-11-12: qty 0.5

## 2023-11-12 MED ORDER — TRAZODONE HCL 50 MG PO TABS
50.0000 mg | ORAL_TABLET | Freq: Every evening | ORAL | Status: DC | PRN
Start: 2023-11-12 — End: 2023-11-13
  Administered 2023-11-12: 50 mg via ORAL
  Filled 2023-11-12: qty 1

## 2023-11-12 MED ORDER — ACETAMINOPHEN 325 MG PO TABS: 650.0000 mg | ORAL_TABLET | Freq: Four times a day (QID) | ORAL | Status: DC | PRN

## 2023-11-12 MED ORDER — HALOPERIDOL 5 MG PO TABS: 5.0000 mg | ORAL_TABLET | Freq: Three times a day (TID) | ORAL | Status: DC | PRN

## 2023-11-12 MED ORDER — RABIES IMMUNE GLOBULIN 300 UNIT/2ML IJ SOLN
20.0000 [IU]/kg | Freq: Once | INTRAMUSCULAR | Status: AC
  Administered 2023-11-12: 1800 [IU] via INTRAMUSCULAR
  Filled 2023-11-12: qty 2

## 2023-11-12 MED ORDER — MAGNESIUM HYDROXIDE 400 MG/5ML PO SUSP: 30.0000 mL | Freq: Every day | ORAL | Status: DC | PRN

## 2023-11-12 MED ORDER — AMOXICILLIN-POT CLAVULANATE 875-125 MG PO TABS
1.0000 | ORAL_TABLET | Freq: Once | ORAL | Status: AC
  Administered 2023-11-12: 1 via ORAL
  Filled 2023-11-12: qty 1

## 2023-11-12 MED ORDER — OLANZAPINE 10 MG IM SOLR: 10.0000 mg | Freq: Three times a day (TID) | INTRAMUSCULAR | Status: DC | PRN

## 2023-11-12 NOTE — ED Provider Notes (Signed)
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 875-125 MG per tablet 1 tablet   acetaminophen  (TYLENOL ) tablet 650 mg   alum & mag hydroxide-simeth (MAALOX/MYLANTA) 200-200-20 MG/5ML suspension 30 mL   magnesium  hydroxide (MILK OF MAGNESIA) suspension 30 mL   haloperidol  (HALDOL ) tablet 5 mg   And   diphenhydrAMINE  (BENADRYL ) capsule 50 mg   OLANZapine  (ZYPREXA ) injection 5 mg   OLANZapine   (ZYPREXA ) injection 10 mg   traZODone  (DESYREL ) tablet 50 mg   PTA Medications  Medication Sig   doxycycline  (VIBRA -TABS) 100 MG tablet Take 1 tablet (100 mg total) by mouth 2 (two) times daily.   lurasidone  (LATUDA ) 20 MG TABS tablet Take 1 tablet (20 mg total) by mouth at bedtime.   traZODone  (DESYREL ) 100 MG tablet Take 2 tablets (200 mg total) by mouth at bedtime.   divalproex  (DEPAKOTE  ER) 500 MG 24 hr tablet Take 4 tablets (2,000 mg total) by mouth at bedtime.      Medical Decision Making  Observation unit    Recommendations  Based on my evaluation the patient does not appear to have an emergency medical condition.  Gaither Pouch, NP 11/13/23  4:30 AM
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
## 2023-11-12 NOTE — ED Provider Notes (Signed)
 [facility]ENC[facility] DEPARTMENT AT [facility] Provider Note   CSN: [id number] Arrival date & time: 11/12/23  1644     Patient presents with: Animal Bite   Mark [name] is a 33 y.o. adult presents with concern for a dog bite to the right hand and arm that occurred yesterday and today.  He states the dog is known to him, but he is unsure of the vaccine status.  He does request rabies vaccine . No fevers chills, spreading redness.     Animal Bite      Prior to Admission medications   Medication Sig Start Date End Date Taking? Authorizing Provider  amoxicillin -clavulanate (AUGMENTIN ) 875-125 MG tablet Take 1 tablet by mouth 2 (two) times daily. 11/13/23  Yes Veta Palma, PA-C  divalproex  (DEPAKOTE  ER) 500 MG 24 hr tablet Take 4 tablets (2,000 mg total) by mouth at bedtime. 11/10/23 02/08/24  Kennyth Starleen RAMAN, MD  doxycycline  (VIBRA -TABS) 100 MG tablet Take 1 tablet (100 mg total) by mouth 2 (two) times daily. 11/10/23   Kennyth Starleen RAMAN, MD  lurasidone  (LATUDA ) 20 MG TABS tablet Take 1 tablet (20 mg total) by mouth at bedtime. 11/10/23   Kennyth Starleen RAMAN, MD  traZODone  (DESYREL ) 100 MG tablet Take 2 tablets (200 mg total) by mouth at bedtime. 11/10/23   Kennyth Starleen RAMAN, MD    Allergies: Other    Review of Systems  Skin:  Positive for wound.    Updated Vital Signs BP 132/77   Pulse 92   Temp 98.1 F (36.7 C) (Oral)   Resp 16   SpO2 99%   Physical Exam Vitals and nursing note reviewed.  Constitutional:      Appearance: Normal appearance.  HENT:     Head: Atraumatic.  Cardiovascular:     Comments: 2+ radial pulse bilaterally Pulmonary:     Effort: Pulmonary effort is normal.  Musculoskeletal:     Comments: Full flexion extension of the right elbow, right wrist.  Full flexion-extension of the 1st through 5th MCPs, DIP, PIPs of the right hand    Skin:    Comments: Bite mark to the right upper arm that is approximately 1 cm in size.  Already scabbing over.   No surrounding edema or erythema.   Bite mark/abrasion to the right hand in the web space between the pinky and ring finger, and another bite mark/abrasion to the space between the ring and index finger.  Bleeding well-controlled.  No surrounding erythema or edema  No red streaks or joint erythema/edema in the RUE  Neurological:     General: No focal deficit present.     Mental Status: He is alert.     Comments: Intact sensation in the right upper extremity  Psychiatric:        Mood and Affect: Mood normal.        Behavior: Behavior normal.          (all labs ordered are listed, but only abnormal results are displayed) Labs Reviewed - No data to display  EKG: None  Radiology: No results found.   Procedures   Medications Ordered in the ED  rabies immune globulin  (HYPERRAB) injection 1,800 Units (has no administration in time range)  rabies vaccine  (RABAVERT ) injection 1 mL (has no administration in time range)  Tdap (BOOSTRIX) injection 0.5 mL (has no administration in time range)  amoxicillin -clavulanate (AUGMENTIN ) 875-125 MG per tablet 1 tablet (has no administration in time range)  Medical Decision Making    Differential diagnosis includes but is not limited to animal bite, rabies exposure, cellulitis, septic joint  ED Course:  Upon initial evaluation, patient is very well-appearing, stable vitals.  He was bitten by a dog to the right hand and right upper arm today and yesterday.  The bite marks are fairly superficial.  Bleeding well-controlled.  No surrounding erythema, edema, red streaking.  No joint erythema or effusions, full range of motion of the joints of the right upper extremity, no concern for septic arthritis.  No fevers, tachycardia, no concern for systemic infection. He is unsure about the dog's vaccine status.  We will start the rabies series here today.  Will give him his first dose of Augmentin  today. I  personally irrigated the dog bites with 1 L of sterile saline.  Stable and appropriate for discharge home   Medications Given: Augmentin  Tdap Rabies vaccine  Rabies immunoglobulin   Impression: Dog Bite  Disposition:  The patient was discharged home with instructions to take 5-day course of Augmentin  as prescribed.  Return to PCP or to the ER on days 3, 7, and 14 (9/4, 9/8, 9/15) for remainder of rabies series. Return precautions given.   This chart was dictated using voice recognition software, Dragon. Despite the best efforts of this provider to proofread and correct errors, errors may still occur which can change documentation meaning.       Final diagnoses:  Dog bite, initial encounter    ED Discharge Orders          Ordered    amoxicillin -clavulanate (AUGMENTIN ) 875-125 MG tablet  2 times daily        11/12/23 1828               Veta Palma, PA-C 11/12/23 1828    Rogelia Jerilynn RAMAN, MD 11/14/23 1539

## 2023-11-12 NOTE — ED Triage Notes (Signed)
 Pt BIB [facility] from home reporting dog bites to R arm (yesterday) and R hand (today.) No bleeding from wounds. Dog belongs to fiance, pt is unsure of vaccination status.

## 2023-11-12 NOTE — Discharge Instructions (Addendum)
 You were seen here today for the dog bites.  You have been started on an antibiotic called Augmentin  to prevent infection.  Please take this twice daily for the next 5 days.  You are given your first dose here today.  Take your next dose tomorrow morning.  Antibiotics can cause you to have diarrhea.   You have been given your first dose of the rabies series here today.  You must get the remainder of the rabies series on 9/4, 9/8, 9/15.  Please notify your PCP so that they can get the rabies shots and make appointments on these days for you. However, if you cannot see your PCP, you can return to the ER for the remainder of these rabies shots.  Your tetanus was updated today.  Please return immediately to the ER for any spreading redness around your dog bite sites, any difficulty moving your joints, fevers, any other new or concerning symptoms
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
## 2023-11-13 ENCOUNTER — Inpatient Hospital Stay (HOSPITAL_COMMUNITY)
Admission: AD | Admit: 2023-11-13 | Discharge: 2023-11-20 | DRG: 885 | Disposition: A | Discharge status: Home / Self Care | Source: Intra-hospital

## 2023-11-13 ENCOUNTER — Encounter (HOSPITAL_COMMUNITY): Payer: Self-pay

## 2023-11-13 DIAGNOSIS — Z818 Family history of other mental and behavioral disorders: Secondary | ICD-10-CM | POA: Diagnosis not present

## 2023-11-13 DIAGNOSIS — Z9151 Personal history of suicidal behavior: Secondary | ICD-10-CM | POA: Diagnosis not present

## 2023-11-13 DIAGNOSIS — W540XXD Bitten by dog, subsequent encounter: Secondary | ICD-10-CM | POA: Diagnosis not present

## 2023-11-13 DIAGNOSIS — Z630 Problems in relationship with spouse or partner: Secondary | ICD-10-CM

## 2023-11-13 DIAGNOSIS — S61451D Open bite of right hand, subsequent encounter: Secondary | ICD-10-CM | POA: Diagnosis not present

## 2023-11-13 DIAGNOSIS — Z23 Encounter for immunization: Secondary | ICD-10-CM

## 2023-11-13 DIAGNOSIS — Z8249 Family history of ischemic heart disease and other diseases of the circulatory system: Secondary | ICD-10-CM

## 2023-11-13 DIAGNOSIS — F1290 Cannabis use, unspecified, uncomplicated: Secondary | ICD-10-CM | POA: Diagnosis present

## 2023-11-13 DIAGNOSIS — Z604 Social exclusion and rejection: Secondary | ICD-10-CM | POA: Diagnosis present

## 2023-11-13 DIAGNOSIS — Z811 Family history of alcohol abuse and dependence: Secondary | ICD-10-CM | POA: Diagnosis not present

## 2023-11-13 DIAGNOSIS — G4700 Insomnia, unspecified: Secondary | ICD-10-CM | POA: Diagnosis present

## 2023-11-13 DIAGNOSIS — Z91148 Patient's other noncompliance with medication regimen for other reason: Secondary | ICD-10-CM | POA: Diagnosis not present

## 2023-11-13 DIAGNOSIS — R45851 Suicidal ideations: Secondary | ICD-10-CM | POA: Diagnosis present

## 2023-11-13 DIAGNOSIS — G8929 Other chronic pain: Secondary | ICD-10-CM | POA: Diagnosis present

## 2023-11-13 DIAGNOSIS — F419 Anxiety disorder, unspecified: Secondary | ICD-10-CM | POA: Diagnosis present

## 2023-11-13 DIAGNOSIS — Z79899 Other long term (current) drug therapy: Secondary | ICD-10-CM

## 2023-11-13 DIAGNOSIS — F70 Mild intellectual disabilities: Secondary | ICD-10-CM | POA: Diagnosis present

## 2023-11-13 DIAGNOSIS — F79 Unspecified intellectual disabilities: Secondary | ICD-10-CM | POA: Diagnosis not present

## 2023-11-13 DIAGNOSIS — F4310 Post-traumatic stress disorder, unspecified: Secondary | ICD-10-CM | POA: Diagnosis present

## 2023-11-13 DIAGNOSIS — Z83438 Family history of other disorder of lipoprotein metabolism and other lipidemia: Secondary | ICD-10-CM | POA: Diagnosis not present

## 2023-11-13 DIAGNOSIS — F3163 Bipolar disorder, current episode mixed, severe, without psychotic features: Principal | ICD-10-CM | POA: Diagnosis present

## 2023-11-13 DIAGNOSIS — F17210 Nicotine dependence, cigarettes, uncomplicated: Secondary | ICD-10-CM | POA: Diagnosis present

## 2023-11-13 LAB — COMPREHENSIVE METABOLIC PANEL WITH GFR
ALT: 27 U/L (ref 0–44)
AST: 25 U/L (ref 15–41)
Albumin: 3.9 g/dL (ref 3.5–5.0)
Alkaline Phosphatase: 54 U/L (ref 38–126)
Anion gap: 12 (ref 5–15)
BUN: 14 mg/dL (ref 6–20)
CO2: 28 mmol/L (ref 22–32)
Calcium: 9.6 mg/dL (ref 8.9–10.3)
Chloride: 98 mmol/L (ref 98–111)
Creatinine, Ser: 0.77 mg/dL (ref 0.61–1.24)
GFR, Estimated: >60 mL/min (ref >60)
Glucose, Bld: 68 mg/dL — ABNORMAL LOW (ref 70–99)
Potassium: 4 mmol/L (ref 3.5–5.1)
Sodium: 138 mmol/L (ref 135–145)
Total Bilirubin: 0.6 mg/dL (ref 0.0–1.2)
Total Protein: 7 g/dL (ref 6.5–8.1)

## 2023-11-13 LAB — TSH: TSH: 1.259 u[IU]/mL (ref 0.350–4.500)

## 2023-11-13 LAB — VALPROIC ACID LEVEL: Valproic Acid Lvl: 102 ug/mL — ABNORMAL HIGH (ref 50–100)

## 2023-11-13 LAB — ETHANOL: Alcohol, Ethyl (B): <15 mg/dL (ref <15)

## 2023-11-13 MED ORDER — TRAZODONE HCL 100 MG PO TABS
200.0000 mg | ORAL_TABLET | Freq: Every day | ORAL | Status: DC
  Administered 2023-11-13 – 2023-11-19 (×7): 200 mg via ORAL
  Filled 2023-11-13 (×7): qty 2

## 2023-11-13 MED ORDER — ACETAMINOPHEN 325 MG PO TABS: 650.0000 mg | ORAL_TABLET | Freq: Four times a day (QID) | ORAL | Status: DC | PRN

## 2023-11-13 MED ORDER — NICOTINE POLACRILEX 2 MG MT GUM
2.0000 mg | CHEWING_GUM | OROMUCOSAL | Status: DC | PRN
  Administered 2023-11-13 (×3): 2 mg via ORAL
  Filled 2023-11-13 (×3): qty 1

## 2023-11-13 MED ORDER — DIPHENHYDRAMINE HCL 25 MG PO CAPS: 50.0000 mg | ORAL_CAPSULE | Freq: Three times a day (TID) | ORAL | Status: DC | PRN

## 2023-11-13 MED ORDER — DIPHENHYDRAMINE HCL 50 MG/ML IJ SOLN: 50.0000 mg | Freq: Three times a day (TID) | INTRAMUSCULAR | Status: DC | PRN

## 2023-11-13 MED ORDER — DIVALPROEX SODIUM ER 500 MG PO TB24
2000.0000 mg | ORAL_TABLET | Freq: Every day | ORAL | Status: DC
  Administered 2023-11-13 – 2023-11-19 (×7): 2000 mg via ORAL
  Filled 2023-11-13 (×7): qty 4

## 2023-11-13 MED ORDER — NICOTINE POLACRILEX 2 MG MT GUM
2.0000 mg | CHEWING_GUM | OROMUCOSAL | Status: DC | PRN
  Administered 2023-11-13 – 2023-11-20 (×31): 2 mg via ORAL
  Filled 2023-11-13 (×10): qty 1

## 2023-11-13 MED ORDER — DIVALPROEX SODIUM ER 500 MG PO TB24: 2000.0000 mg | ORAL_TABLET | Freq: Every day | ORAL | Status: DC

## 2023-11-13 MED ORDER — ALUM & MAG HYDROXIDE-SIMETH 200-200-20 MG/5ML PO SUSP: 30.0000 mL | ORAL | Status: DC | PRN

## 2023-11-13 MED ORDER — FLUOXETINE HCL 20 MG PO CAPS
20.0000 mg | ORAL_CAPSULE | Freq: Every day | ORAL | Status: DC
  Administered 2023-11-14 – 2023-11-15 (×2): 20 mg via ORAL
  Filled 2023-11-13 (×2): qty 1

## 2023-11-13 MED ORDER — LORAZEPAM 2 MG/ML IJ SOLN: 2.0000 mg | Freq: Three times a day (TID) | INTRAMUSCULAR | Status: DC | PRN

## 2023-11-13 MED ORDER — LURASIDONE HCL 20 MG PO TABS: 20.0000 mg | ORAL_TABLET | Freq: Every day | ORAL | Status: DC

## 2023-11-13 MED ORDER — HALOPERIDOL LACTATE 5 MG/ML IJ SOLN
5.0000 mg | Freq: Three times a day (TID) | INTRAMUSCULAR | Status: DC | PRN
Start: 2023-11-13 — End: 2023-11-20

## 2023-11-13 MED ORDER — MAGNESIUM HYDROXIDE 400 MG/5ML PO SUSP: 30.0000 mL | Freq: Every day | ORAL | Status: DC | PRN

## 2023-11-13 MED ORDER — AMOXICILLIN-POT CLAVULANATE 875-125 MG PO TABS
1.0000 | ORAL_TABLET | Freq: Two times a day (BID) | ORAL | Status: DC
  Administered 2023-11-13: 1 via ORAL
  Filled 2023-11-13: qty 1

## 2023-11-13 MED ORDER — LURASIDONE HCL 20 MG PO TABS
20.0000 mg | ORAL_TABLET | Freq: Every day | ORAL | Status: DC
  Administered 2023-11-13: 20 mg via ORAL
  Filled 2023-11-13: qty 1

## 2023-11-13 MED ORDER — AMOXICILLIN-POT CLAVULANATE 875-125 MG PO TABS
1.0000 | ORAL_TABLET | Freq: Two times a day (BID) | ORAL | Status: AC
  Administered 2023-11-13 – 2023-11-17 (×9): 1 via ORAL
  Filled 2023-11-13 (×9): qty 1

## 2023-11-13 MED ORDER — HALOPERIDOL LACTATE 5 MG/ML IJ SOLN: 10.0000 mg | Freq: Three times a day (TID) | INTRAMUSCULAR | Status: DC | PRN

## 2023-11-13 MED ORDER — FLUOXETINE HCL 20 MG PO CAPS
20.0000 mg | ORAL_CAPSULE | Freq: Every day | ORAL | Status: DC
  Administered 2023-11-13: 20 mg via ORAL
  Filled 2023-11-13: qty 1

## 2023-11-13 MED ORDER — HYDROXYZINE HCL 10 MG PO TABS
10.0000 mg | ORAL_TABLET | Freq: Three times a day (TID) | ORAL | Status: DC | PRN
  Administered 2023-11-16 – 2023-11-20 (×8): 10 mg via ORAL
  Filled 2023-11-13 (×7): qty 1

## 2023-11-13 MED ORDER — HALOPERIDOL 5 MG PO TABS: 5.0000 mg | ORAL_TABLET | Freq: Three times a day (TID) | ORAL | Status: DC | PRN

## 2023-11-13 MED ORDER — TRAZODONE HCL 100 MG PO TABS: 200.0000 mg | ORAL_TABLET | Freq: Every day | ORAL | Status: DC

## 2023-11-13 NOTE — Tx Team (Signed)
 Initial Treatment Plan 11/13/2023 5:09 PM Cristoval Teall [id number]    PATIENT STRESSORS: Traumatic event     PATIENT STRENGTHS: Motivation for treatment/growth    PATIENT IDENTIFIED PROBLEMS:   SI plan to jump off bridge    unemployed    Dog bites           DISCHARGE CRITERIA:  Improved stabilization in mood, thinking, and/or behavior  PRELIMINARY DISCHARGE PLAN: Outpatient therapy  PATIENT/FAMILY INVOLVEMENT: This treatment plan has been presented to and reviewed with the patient, Mark [name].  The patient has been given the opportunity to ask questions and make suggestions.  Shanda BIRCH Mark Losh, RN 11/13/2023, 5:09 PM

## 2023-11-13 NOTE — Discharge Instructions (Addendum)
 Transfer to [facility]

## 2023-11-13 NOTE — ED Notes (Signed)
 Patient resting with eyes closed. Respirations even and unlabored. No distress noted. Environment secured. Plan of care ongoing, no further concerns as of present.
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
## 2023-11-13 NOTE — ED Notes (Signed)
 Patient is sleeping no problem at this time

## 2023-11-13 NOTE — ED Notes (Signed)
 After taking his Trazodone  patient is in a deep sleep.

## 2023-11-13 NOTE — ED Notes (Signed)
 Patient does not want information to be shared with anyone, including his emergency contact. Patient A&Ox4. Denies intent/thoughts to harm self/others when asked. Denies A/VH. Patient denies any physical complaints when asked. No distress noted. Support and encouragement provided. Routine safety checks conducted according to facility protocol. Encouraged patient to notify staff if thoughts of harm toward self or others arise. Endorses safety. Patient verbalized understanding and agreement. Plan of care ongoing, no further concerns as of present. Patient expresses no other needs at this time.

## 2023-11-13 NOTE — Plan of Care (Signed)
   Problem: Education: Goal: Knowledge of [place] [facility] Education information/materials will improve Outcome: Progressing Goal: Emotional status will improve Outcome: Progressing Goal: Mental status will improve Outcome: Progressing
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
## 2023-11-13 NOTE — Plan of Care (Signed)
   Problem: Education: Goal: Knowledge of [facility] Education information/materials will improve Outcome: Progressing Goal: Verbalization of understanding the information provided will improve Outcome: Progressing

## 2023-11-14 ENCOUNTER — Encounter (HOSPITAL_COMMUNITY): Payer: Self-pay

## 2023-11-14 ENCOUNTER — Ambulatory Visit (HOSPITAL_COMMUNITY): Admitting: Psychiatry

## 2023-11-14 DIAGNOSIS — F3163 Bipolar disorder, current episode mixed, severe, without psychotic features: Principal | ICD-10-CM

## 2023-11-14 LAB — CBC WITH DIFFERENTIAL/PLATELET
Abs Immature Granulocytes: 0.16 K/uL — ABNORMAL HIGH (ref 0.00–0.07)
Basophils Absolute: 0.1 K/uL (ref 0.0–0.1)
Basophils Relative: 1 %
Eosinophils Absolute: 0.4 K/uL (ref 0.0–0.5)
Eosinophils Relative: 3 %
HCT: 44.6 % (ref 39.0–52.0)
Hemoglobin: 14.3 g/dL (ref 13.0–17.0)
Immature Granulocytes: 1 %
Lymphocytes Relative: 27 %
Lymphs Abs: 3.4 K/uL (ref 0.7–4.0)
MCH: 29.4 pg (ref 26.0–34.0)
MCHC: 32.1 g/dL (ref 30.0–36.0)
MCV: 91.8 fL (ref 80.0–100.0)
Monocytes Absolute: 1.4 K/uL — ABNORMAL HIGH (ref 0.1–1.0)
Monocytes Relative: 11 %
Neutro Abs: 6.9 K/uL (ref 1.7–7.7)
Neutrophils Relative %: 57 %
Platelets: 305 K/uL (ref 150–400)
RBC: 4.86 MIL/uL (ref 4.22–5.81)
RDW: 12.5 % (ref 11.5–15.5)
WBC: 12.3 K/uL — ABNORMAL HIGH (ref 4.0–10.5)
nRBC: 0 % (ref 0.0–0.2)

## 2023-11-14 MED ORDER — LURASIDONE HCL 20 MG PO TABS
20.0000 mg | ORAL_TABLET | Freq: Every day | ORAL | Status: DC
  Administered 2023-11-14: 20 mg via ORAL
  Filled 2023-11-14: qty 1

## 2023-11-14 NOTE — Progress Notes (Signed)
(  Sleep Hours) -6.25 (Any PRNs that were needed, meds refused, or side effects to meds)- nicotine  gum (Any disturbances and when (visitation, over night)-none (Concerns raised by the patient)-none  (SI/HI/AVH)- Denies all

## 2023-11-14 NOTE — Group Note (Signed)
 Date:  11/14/2023 Time:  5:42 PM  Group Topic/Focus:  Dimensions of Wellness:   The focus of this group is to introduce the topic of wellness and discuss the role each dimension of wellness plays in total health.    Participation Level:  Active  Participation Quality:  Appropriate  Affect:  Appropriate  Cognitive:  Alert  Insight: Appropriate  Engagement in Group:  Engaged  Modes of Intervention:  Discussion    Mark [name]  Mark [name] 11/14/2023, 5:42 PM

## 2023-11-14 NOTE — Progress Notes (Signed)
   11/14/23 2300  Psych Admission Type (Psych Patients Only)  Admission Status Voluntary  Psychosocial Assessment  Patient Complaints Depression  Eye Contact Fair  Facial Expression Animated  Affect Appropriate to circumstance  Speech Logical/coherent  Interaction Assertive  Motor Activity Slow  Appearance/Hygiene Unremarkable  Behavior Characteristics Cooperative  Mood Pleasant  Thought Process  Coherency WDL  Content WDL  Delusions None reported or observed  Perception WDL  Hallucination None reported or observed  Judgment Limited  Confusion None  Danger to Self  Current suicidal ideation? Denies  Self-Injurious Behavior No self-injurious ideation or behavior indicators observed or expressed   Agreement Not to Harm Self Yes  Description of Agreement Verbal  Danger to Others  Danger to Others None reported or observed

## 2023-11-14 NOTE — Group Note (Signed)
 Recreation Therapy Group Note   Group Topic:Problem Solving  Group Date: 11/14/2023 Start Time: 0935 End Time: 1005 Facilitators: Keefe Zawistowski-McCall, LRT,CTRS Location: [address] Dayroom   Group Topic: Communication, Team Building, Problem Solving  Goal Area(s) Addresses:  Patient will effectively work with peer towards shared goal.  Patient will identify skills used to make activity successful.  Patient will identify how skills used during activity can be used to reach post d/c goals.   Behavioral Response: Observant  Intervention: STEM Activity  Activity: Landing Pad. In teams of 3-5, patients were given 12 plastic drinking straws and an equal length of masking tape. Using the materials provided, patients were asked to build a landing pad to catch a golf ball dropped from approximately 5 feet in the air. All materials were required to be used by the team in their design. LRT facilitated post-activity discussion.  Education: Pharmacist, community, Scientist, physiological, Discharge Planning   Education Outcome: Acknowledges education/In group clarification offered/Needs additional education.    Affect/Mood: Appropriate   Participation Level: None   Participation Quality: None   Behavior: On-looking   Speech/Thought Process: Loud   Insight: None   Judgement: None   Modes of Intervention: STEM Activity   Patient Response to Interventions:  Attentive   Education Outcome:  In group clarification offered    Clinical Observations/Individualized Feedback: Pt came in late to group. Pt wasn't involved in the activity but had a comment for everything. Pt comes off as someone who thinks he knows everything and is always right. Pt watched as peers participated in group.      Plan: Continue to engage patient in RT group sessions 2-3x/week.   Mark [name], LRT,CTRS 11/14/2023 12:47 PM
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
## 2023-11-14 NOTE — H&P (Addendum)
 [facility] Psychiatric Adult H&P  Patient Identification: Mark [name] MRN:  [id number] Date of Evaluation:  11/14/2023 Chief Complaint:  Bipolar 1 disorder, mixed, severe (HCC) [F31.63] Principal Diagnosis: Bipolar 1 disorder, mixed, severe (HCC) Diagnosis:  Principal Problem:   Bipolar 1 disorder, mixed, severe (HCC)    Subjective  CC: Suicidal Ideations   Patient is a 33 year old male with a history of IDD, bipolar disorder, ADHD, PTSD, suicidal ideation.  Presented to [facility] via [facility] for worsening suicidal ideation.  Patient was recently discharged from [facility] health behavioral health [facility] on 8/25 where he was admitted for the same.  Significant past medical history include recent dog bite (currently on antibiotics), and seizures.   After discharge on 8/25, patient was considered safe because of his protective factors of his sister who lives in [place]  whom he lives and his fiance.  On 9/1, his fiance's dog bit his hand and right arm leading to 3 lacerations none of which longer than 2 cm in length.  This led to an argument and the dog's been taken away.  The patient was also angry at his fiance and poured juice on her in his frustration.  He has worsening concerned that his relationship with his fiance may end because of the situation with the dog which has worsened his SI and he presented to the urgent care for safety.   HPI  Patient interview: Patient verifies the above is accurate and expresses depression as 9/10 and anxiety 9/10.  He endorses problems with sleep, interest in daily activities, concentration, and guilt.  He says that his suicidality is on and off.  Denies auditory and visual hallucinations.  Felt that when he was admitted recently the most beneficial thing for him was the therapeutic milieu and talking with friends here. Seizures started 2 weeks ago and he has had 2 of them.  One of them seem to be a focal event while the other appear to  be a generalized event.  He was worked up both times at the hospital and both of these workups were on the same day.  He has not yet received an EEG and is planning on getting one in the outpatient setting with his primary care provider who is currently out of town.  These appear to occur with stress, patient is alert during the events. Bipolar history is marked with irritability of duration that can last for as long as 10 to 15 minutes.  Patient denies a manic sleep-wake pattern, however is on trazodone  200 mg nightly.  Patient was recently started on lurasidone  which he states he is not sure when he supposed to take it.  Patient also on 2 g of Depakote  at night and Prozac .  Collateral: Spoke with patient's guardian Terrea Baron [phone]).  She expresses concern of his erratic behavior over the last month citing when he was on a bridge prior to his previous admission with suicidal thoughts.  He has been working with him since January and over the last month this has been relatively new behavior.  Past Psychiatric Hx: Is seeing Dr. Prentice Espy as his outpatient psychiatrist.  Was recently discharged from [facility] 8/25 for SI. Is on Depakote  2000 mg daily for bipolar and seizures, lurasidone  20 recently added for bipolar disorder, trazodone  200 mg nightly for insomnia, and Prozac  20 mg daily for mood.  Substance Use Hx: Marijuana use, about 15 blunts a week Denies alcohol, nicotine , or other drug use.  Family History: family history includes  Hyperlipidemia in his mother; Hypertension in his mother.  Family psychiatric history per patient includes depression and anxiety in his mother.  Bipolar disorder in his father.  Alcoholism on both sides of the family.  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  He has previously split bills with his fiance, however is unsure what their financial arrangement will look like moving  forward if they were to stay together.  Has friends, but is only close with his sister in [place]  in regards to family.  Events leading up to admission include a fight with his fiance patient is concerned will lead to their separation.  Legal: denies any upcoming court dates.   Access to firearms: no per patient   Total Time spent with patient: 1 hour  Is the patient at risk to self? Yes.    Has the patient been a risk to self in the past 6 months? Yes.    Has the patient been a risk to self within the distant past? No.  Is the patient a risk to others? No.  Has the patient been a risk to others in the past 6 months? No.  Has the patient been a risk to others within the distant past? No.    Tobacco Screening:  Social History   Tobacco Use  Smoking Status Every Day   Current packs/day: 1.00   Average packs/day: 1 pack/day for 0.7 years (0.7 ttl pk-yrs)   Types: Cigarettes   Start date: 2025  Smokeless Tobacco Not on file    BH Tobacco Counseling     Are you interested in Tobacco Cessation Medications?  No, patient refused Counseled patient on smoking cessation:  Refused/Declined practical counseling Reason Tobacco Screening Not Completed: Patient Refused Screening       Social History:  Social History   Substance and Sexual Activity  Alcohol Use No     Social History   Substance and Sexual Activity  Drug Use Yes   Types: Marijuana      Objective  Labs  EKG monitoring: QTc: 457 on 9/1  Metabolism / endocrine: BMI: Body mass index is 24.39 kg/m.  CBC: WBC 20.2 on 9/1.  Currently on antibiotics.  Reordered new CBC CMP: unremarkable UDS: + for Marijuana Ethanol: <10 TSH: WNL   Mental Status Exam  Apperance: Appropriate for environment, Sitting upright, and Standing Behavior: Calm Speech: Normal Rate, Articulate, Normal Volume, and Responsive Attitude: Cooperative and Friendly Mood: a little stressed Affect: Euthymic, DEPRESSED, Normal Range, and  Mood Congruent Perception: Not responding to internal stimuli Thought Content: within normal limits Thought Form: Goal Directed, Organized, Linear, and Logical Cognition: Alert & Oriented to person, place, and time, Recent and Remote memory grossly intact by recounting personal history, and Immediate memory grossly intact by interview Judgment: POOR Insight: Fair    Physical Exam: Physical Exam Vitals reviewed.  Constitutional:      General: He is not in acute distress.    Appearance: He is not toxic-appearing.  Pulmonary:     Effort: Pulmonary effort is normal. No respiratory distress.  Skin:    General: Skin is warm and dry.     Comments: Dog bite wounds examined on R hand/ elbow.  Appear to be well-healing, minimal erythema around the edges.  No streaking, pus, or overt signs of infection.  Neurological:     Mental Status: He is alert.     Gait: Gait normal.  Review of Systems  Respiratory: Negative.    Cardiovascular: Negative.   Gastrointestinal: Negative.    Blood pressure 115/65, pulse 67, temperature 97.7 F (36.5 C), temperature source Oral, resp. rate 18, height 6' 2 (1.88 m), weight 86.2 kg, SpO2 98%. Body mass index is 24.39 kg/m.  Allergies:  No Known Allergies Lab Results:  Results for orders placed or performed during the hospital encounter of 11/12/23 (from the past 48 hours)  POCT Urine Drug Screen - (I-Screen)     Status: Abnormal   Collection Time: 11/12/23  9:52 PM  Result Value Ref Range   POC Amphetamine UR None Detected NONE DETECTED (Cut Off Level 1000 ng/mL)   POC Secobarbital (BAR) None Detected NONE DETECTED (Cut Off Level 300 ng/mL)   POC Buprenorphine (BUP) None Detected NONE DETECTED (Cut Off Level 10 ng/mL)   POC Oxazepam (BZO) None Detected NONE DETECTED (Cut Off Level 300 ng/mL)   POC Cocaine UR None Detected NONE DETECTED (Cut Off Level 300 ng/mL)   POC Methamphetamine UR None Detected NONE DETECTED (Cut Off Level 1000 ng/mL)   POC  Morphine None Detected NONE DETECTED (Cut Off Level 300 ng/mL)   POC Methadone UR None Detected NONE DETECTED (Cut Off Level 300 ng/mL)   POC Oxycodone  UR None Detected NONE DETECTED (Cut Off Level 100 ng/mL)   POC Marijuana UR Positive (A) NONE DETECTED (Cut Off Level 50 ng/mL)  CBC with Differential/Platelet     Status: Abnormal   Collection Time: 11/12/23 10:02 PM  Result Value Ref Range   WBC 20.2 (H) 4.0 - 10.5 K/uL   RBC 5.18 4.22 - 5.81 MIL/uL   Hemoglobin 15.5 13.0 - 17.0 g/dL   HCT 52.9 60.9 - 47.9 %   MCV 90.7 80.0 - 100.0 fL   MCH 29.9 26.0 - 34.0 pg   MCHC 33.0 30.0 - 36.0 g/dL   RDW 87.4 88.4 - 84.4 %   Platelets 359 150 - 400 K/uL   nRBC 0.0 0.0 - 0.2 %   Neutrophils Relative % 71 %   Neutro Abs 14.4 (H) 1.7 - 7.7 K/uL   Lymphocytes Relative 16 %   Lymphs Abs 3.3 0.7 - 4.0 K/uL   Monocytes Relative 9 %   Monocytes Absolute 1.8 (H) 0.1 - 1.0 K/uL   Eosinophils Relative 1 %   Eosinophils Absolute 0.2 0.0 - 0.5 K/uL   Basophils Relative 1 %   Basophils Absolute 0.1 0.0 - 0.1 K/uL   Immature Granulocytes 2 %   Abs Immature Granulocytes 0.35 (H) 0.00 - 0.07 K/uL    Comment: Performed at [facility], [address] [place], [place] [address]  Comprehensive metabolic panel     Status: Abnormal   Collection Time: 11/12/23 10:02 PM  Result Value Ref Range   Sodium 138 135 - 145 mmol/L   Potassium 4.0 3.5 - 5.1 mmol/L   Chloride 98 98 - 111 mmol/L   CO2 28 22 - 32 mmol/L   Glucose, Bld 68 (L) 70 - 99 mg/dL    Comment: Glucose reference range applies only to samples taken after fasting for at least 8 hours.   BUN 14 6 - 20 mg/dL   Creatinine, Ser 9.22 0.61 - 1.24 mg/dL   Calcium 9.6 8.9 - 89.6 mg/dL   Total Protein 7.0 6.5 - 8.1 g/dL   Albumin 3.9 3.5 - 5.0 g/dL   AST 25 15 - 41 U/L   ALT 27 0 - 44 U/L  Alkaline Phosphatase 54 38 - 126 U/L   Total Bilirubin 0.6 0.0 - 1.2 mg/dL   GFR, Estimated >39 >39 mL/min    Comment: (NOTE) Calculated using the CKD-EPI  Creatinine Equation (2021)    Anion gap 12 5 - 15    Comment: Performed at [facility], [address] [place], [place] [address]  Ethanol     Status: None   Collection Time: 11/12/23 10:02 PM  Result Value Ref Range   Alcohol, Ethyl (B) <15 <15 mg/dL    Comment: (NOTE) For medical purposes only. Performed at [facility], [address] [place], [place] [address]   TSH     Status: None   Collection Time: 11/12/23 10:02 PM  Result Value Ref Range   TSH 1.259 0.350 - 4.500 uIU/mL    Comment: Performed by a 3rd Generation assay with a functional sensitivity of <=0.01 uIU/mL. Performed at [facility], [address] [place], [place] [address]   Valproic acid  level     Status: Abnormal   Collection Time: 11/12/23 10:02 PM  Result Value Ref Range   Valproic Acid  Lvl 102 (H) 50 - 100 ug/mL    Comment: Performed at [facility], [address] [place], [place] [address]    Blood Alcohol level:  Lab Results  Component Value Date   [facility] <15 11/12/2023   ETH <15 11/02/2023    Metabolic Disorder Labs:  Lab Results  Component Value Date   HGBA1C 5.2 11/05/2023   MPG 102.54 11/05/2023   MPG 154 09/04/2014   No results found for: PROLACTIN Lab Results  Component Value Date   CHOL 185 11/06/2023   TRIG 148 11/06/2023   HDL 40 (L) 11/06/2023   CHOLHDL 4.6 11/06/2023   VLDL 30 11/06/2023   LDLCALC 115 (H) 11/06/2023   LDLCALC 77 11/05/2023    [place]ia Scale:  Flowsheet Row [facility] Most recent reading at 11/13/2023  5:00 PM ED from 11/12/2023 in [facility] Most recent reading at 11/12/2023 10:46 PM ED from 11/12/2023 in [facility] Emergency Department at [facility] Most recent reading at 11/12/2023  4:50 PM  C-SSRS RISK CATEGORY High Risk High Risk High Risk    Current Medications: Current Facility-Administered Medications  Medication  Dose Route Frequency Provider Last Rate Last Admin   acetaminophen  (TYLENOL ) tablet 650 mg  650 mg Oral Q6H PRN Wise, James Benjamin Stallworth, MD       alum & mag hydroxide-simeth (MAALOX/MYLANTA) 200-200-20 MG/5ML suspension 30 mL  30 mL Oral Q4H PRN Delsie Lynwood Morene Lavone, MD       amoxicillin -clavulanate (AUGMENTIN ) 875-125 MG per tablet 1 tablet  1 tablet Oral BID Delsie Lynwood Morene Lavone, MD   1 tablet at 11/14/23 0801   haloperidol  (HALDOL ) tablet 5 mg  5 mg Oral TID PRN Delsie Lynwood Morene Lavone, MD       And   diphenhydrAMINE  (BENADRYL ) capsule 50 mg  50 mg Oral TID PRN Delsie Lynwood Morene Lavone, MD       haloperidol  lactate (HALDOL ) injection 5 mg  5 mg Intramuscular TID PRN Delsie Lynwood Morene Lavone, MD       And   diphenhydrAMINE  (BENADRYL ) injection 50 mg  50 mg Intramuscular TID PRN Delsie Lynwood Morene Lavone, MD       And   LORazepam  (ATIVAN ) injection 2 mg  2 mg Intramuscular  TID PRN Delsie Lynwood Morene Lavone, MD       haloperidol  lactate (HALDOL ) injection 10 mg  10 mg Intramuscular TID PRN Delsie Lynwood Morene Lavone, MD       And   diphenhydrAMINE  (BENADRYL ) injection 50 mg  50 mg Intramuscular TID PRN Delsie Lynwood Morene Lavone, MD       And   LORazepam  (ATIVAN ) injection 2 mg  2 mg Intramuscular TID PRN Delsie Lynwood Morene Lavone, MD       divalproex  (DEPAKOTE  ER) 24 hr tablet 2,000 mg  2,000 mg Oral QHS Delsie Lynwood Morene Lavone, MD   2,000 mg at 11/13/23 2115   FLUoxetine  (PROZAC ) capsule 20 mg  20 mg Oral Daily Delsie Lynwood Morene Lavone, MD   20 mg at 11/14/23 0801   hydrOXYzine  (ATARAX ) tablet 10 mg  10 mg Oral TID PRN Delsie Lynwood Morene Lavone, MD       lurasidone  (LATUDA ) tablet 20 mg  20 mg Oral Q supper Sinai Illingworth B, DO       magnesium  hydroxide (MILK OF MAGNESIA) suspension 30 mL  30 mL Oral Daily PRN Delsie Lynwood Morene Lavone, MD       nicotine  polacrilex (NICORETTE ) gum 2 mg   2 mg Oral PRN Delsie Lynwood Morene Lavone, MD   2 mg at 11/14/23 1515   traZODone  (DESYREL ) tablet 200 mg  200 mg Oral QHS Delsie Lynwood Morene Lavone, MD   200 mg at 11/13/23 2115   PTA Medications: Medications Prior to Admission  Medication Sig Dispense Refill Last Dose/Taking   amoxicillin -clavulanate (AUGMENTIN ) 875-125 MG tablet Take 1 tablet by mouth 2 (two) times daily. 9 tablet 0    divalproex  (DEPAKOTE  ER) 500 MG 24 hr tablet Take 4 tablets (2,000 mg total) by mouth at bedtime. 120 tablet 0    lurasidone  (LATUDA ) 20 MG TABS tablet Take 1 tablet (20 mg total) by mouth at bedtime. 30 tablet 0    traZODone  (DESYREL ) 100 MG tablet Take 2 tablets (200 mg total) by mouth at bedtime. 14 tablet 0     Musculoskeletal: Strength & Muscle Tone: within normal limits Gait & Station: normal Patient leans: N/A  Psychiatric Specialty Exam:  Presentation  General Appearance:  Appropriate for Environment  Eye Contact: Fair  Speech: Clear and Coherent; Normal Rate  Speech Volume: Normal  Handedness: Right   Mood and Affect  Mood: Depressed  Affect: Congruent; Appropriate; Constricted   Thought Process  Thought Processes: Coherent  Descriptions of Associations: Intact  Orientation: Full (Time, Place and Person)  Thought Content: Logical  History of Schizophrenia/Schizoaffective disorder: No  Duration of Psychotic Symptoms:N/A Hallucinations: Hallucinations: None  Ideas of Reference: None  Suicidal Thoughts: Suicidal Thoughts: Yes, Passive SI Passive Intent and/or Plan: With Intent; With Plan  Homicidal Thoughts: Homicidal Thoughts: No   Sensorium  Memory: Immediate Fair; Recent Fair  Judgment: Poor  Insight: Poor   Executive Functions  Concentration: Fair  Attention Span: Fair  Recall: [employer] of Knowledge: Fair  Language: Fair   Psychomotor Activity  Psychomotor Activity: Psychomotor Activity: Normal   Assets   Assets: Desire for Improvement; Communication Skills; Housing; Physical Health; Social Support; Health and safety inspector   Sleep  Sleep: Sleep: Fair    Assessment & Plan    Diagnoses / Active Problems:  Principal Problem:   Bipolar 1 disorder, mixed, severe (HCC)    Patient is a 33 year old male with a history of IDD, bipolar disorder, ADHD, PTSD, suicidal ideation.  Presented to  [facility] via [facility] for worsening suicidal ideation.  Patient was recently discharged from [facility] health behavioral health [facility] on 8/25 where he was admitted for the same.  Significant past medical history include recent dog bite (currently on antibiotics), and seizures.   SUICIDE RISK:   Mild:  Suicidal ideation of limited frequency, intensity, duration, and specificity.  There are no identifiable plans, no associated intent, mild dysphoria and related symptoms, good self-control (both objective and subjective assessment), few other risk factors, and identifiable protective factors, including available and accessible social support.  PLAN:  # Bipolar 1 disorder, mixed, severe # IDD Patient has a long history of bipolar 1 disorder.  Personal history is difficult to assess severity of it with the IDD, however it appears to be relatively well-controlled.  However his recent depressed mood may be consistent with a depressive episode of bipolar 1.  Will start by continuing home medications and helping patient with the therapeutic milieu of the unit and continue to reassess. - Continue home trazodone  200 mg nightly - Continue home Depakote  2 g nightly - Continue home lurasidone  20 mg with dinner.  This is a newer medication and may be considered for up titration for symptomatic control. - Continue home Prozac  20 mg daily   Psychiatry General PRNs  -- Hydroxyzine  10 mg 3 times daily as needed for anxiety  -- Agitation protocol: Haldol , Benadryl , lorazepam    Medical Issues Being Addressed:  #Dog  Bite  -Continue Augmentin  through 9/6 as prescribed by the ED doc evaluating the dog bite. -Repeat CBC   Medical General PRNs  - Tylenol  tablets 650 mg every 6 hours as needed for pain - Maalox/Mylanta suspension 30 mL every 4 hours as needed for indigestion - Milk of Magnesia 30 mL daily as needed for constipation   Discharge Planning:              -- Social work and case management to assist with discharge planning and identification of hospital follow-up needs prior to discharge             -- Estimated LOS: 5-7 days             -- Discharge Concerns: Need to establish a safety plan; Medication compliance and effectiveness             -- Discharge Goals: Return home with outpatient referrals for mental health follow-up including medication management/psychotherapy   Safety and Monitoring:             -- Voluntary admission to inpatient psychiatric unit for safety, stabilization and treatment             -- Daily contact with patient to assess and evaluate symptoms and progress in treatment             -- Patient's case to be discussed in multi-disciplinary team meeting             -- Observation Level : q15 minute checks             -- Vital signs:  q12 hours             -- Precautions: suicide, elopement, and assault  Group Therapy Encouraged patient to participate in unit milieu and in scheduled group therapies  -- Short Term Goals: Ability to identify changes in lifestyle to reduce recurrence of condition will improve, Ability to verbalize feelings will improve, Ability to disclose and discuss suicidal ideas, Ability to demonstrate self-control will improve, Ability to identify  and develop effective coping behaviors will improve, Ability to maintain clinical measurements within normal limits will improve, Compliance with prescribed medications will improve, and Ability to identify triggers associated with substance abuse/mental health issues will improve. -- Long Term Goals:  Improvement in symptoms so as ready for discharge.  The risks/benefits/side-effects/alternatives to medication were discussed in detail with the patient and time was given for questions. The patient consents to medication trial.            I certify that inpatient services furnished can reasonably be expected to improve the patient's condition.   CHARM Penne Mori Psychiatry  PGY-1 11/14/2023, 3:26 PM

## 2023-11-14 NOTE — Group Note (Signed)
 Date:  11/14/2023 Time:  10:32 AM  Group Topic/Focus:  Goals Group:   The focus of this group is to help patients establish daily goals to achieve during treatment and discuss how the patient can incorporate goal setting into their daily lives to aide in recovery.    Participation Level:  Did Not Attend  Participation Quality:  NA  Affect:  NA  Cognitive:  NA  Insight: None  Engagement in Group:  NA  Modes of Intervention:  NA  Additional Comments:  NA  Janaiya Beauchesne A Karnell Vanderloop 11/14/2023, 10:32 AM

## 2023-11-14 NOTE — [facility] Group Notes (Signed)

## 2023-11-14 NOTE — BH IP Treatment Plan (Signed)
 Interdisciplinary Treatment and Diagnostic Plan Update  11/14/2023 Time of Session: 10:25 AM Mark [name] MRN: [id number]  Principal Diagnosis: Bipolar 1 disorder, mixed, severe (HCC)  Secondary Diagnoses: Principal Problem:   Bipolar 1 disorder, mixed, severe (HCC) Active Problems:   Intellectual disability   Current Medications:  Current Facility-Administered Medications  Medication Dose Route Frequency Provider Last Rate Last Admin   acetaminophen  (TYLENOL ) tablet 650 mg  650 mg Oral Q6H PRN Wise, James Benjamin Stallworth, MD       alum & mag hydroxide-simeth (MAALOX/MYLANTA) 200-200-20 MG/5ML suspension 30 mL  30 mL Oral Q4H PRN Delsie Lynwood Morene Lavone, MD       amoxicillin -clavulanate (AUGMENTIN ) 875-125 MG per tablet 1 tablet  1 tablet Oral BID Prunty, Donald B, DO   1 tablet at 11/14/23 1635   haloperidol  (HALDOL ) tablet 5 mg  5 mg Oral TID PRN Delsie Lynwood Morene Lavone, MD       And   diphenhydrAMINE  (BENADRYL ) capsule 50 mg  50 mg Oral TID PRN Delsie Lynwood Morene Lavone, MD       haloperidol  lactate (HALDOL ) injection 5 mg  5 mg Intramuscular TID PRN Delsie Lynwood Morene Lavone, MD       And   diphenhydrAMINE  (BENADRYL ) injection 50 mg  50 mg Intramuscular TID PRN Delsie Lynwood Morene Lavone, MD       And   LORazepam  (ATIVAN ) injection 2 mg  2 mg Intramuscular TID PRN Delsie Lynwood Morene Lavone, MD       haloperidol  lactate (HALDOL ) injection 10 mg  10 mg Intramuscular TID PRN Delsie Lynwood Morene Lavone, MD       And   diphenhydrAMINE  (BENADRYL ) injection 50 mg  50 mg Intramuscular TID PRN Delsie Lynwood Morene Lavone, MD       And   LORazepam  (ATIVAN ) injection 2 mg  2 mg Intramuscular TID PRN Delsie Lynwood Morene Lavone, MD       divalproex  (DEPAKOTE  ER) 24 hr tablet 2,000 mg  2,000 mg Oral QHS Delsie Lynwood Morene Lavone, MD   2,000 mg at 11/13/23 2115   FLUoxetine  (PROZAC ) capsule 20 mg  20 mg Oral Daily Delsie Lynwood Morene Lavone, MD   20 mg at 11/14/23 0801   hydrOXYzine  (ATARAX ) tablet 10 mg  10 mg Oral TID PRN Delsie Lynwood Morene Lavone, MD       lurasidone  (LATUDA ) tablet 20 mg  20 mg Oral Q supper Prunty, Donald B, DO   20 mg at 11/14/23 1635   magnesium  hydroxide (MILK OF MAGNESIA) suspension 30 mL  30 mL Oral Daily PRN Delsie Lynwood Morene Lavone, MD       nicotine  polacrilex (NICORETTE ) gum 2 mg  2 mg Oral PRN Delsie Lynwood Morene Lavone, MD   2 mg at 11/14/23 1805   traZODone  (DESYREL ) tablet 200 mg  200 mg Oral QHS Delsie Lynwood Morene Lavone, MD   200 mg at 11/13/23 2115   PTA Medications: Medications Prior to Admission  Medication Sig Dispense Refill Last Dose/Taking   amoxicillin -clavulanate (AUGMENTIN ) 875-125 MG tablet Take 1 tablet by mouth 2 (two) times daily. 9 tablet 0    divalproex  (DEPAKOTE  ER) 500 MG 24 hr tablet Take 4 tablets (2,000 mg total) by mouth at bedtime. 120 tablet 0    lurasidone  (LATUDA ) 20 MG TABS tablet Take 1 tablet (20 mg total) by mouth at bedtime. 30 tablet 0    traZODone  (DESYREL ) 100 MG tablet Take 2 tablets (200 mg total) by mouth  at bedtime. 14 tablet 0     Patient Stressors: Traumatic event    Patient Strengths: Motivation for treatment/growth   Treatment Modalities: Medication Management, Group therapy, Case management,  1 to 1 session with clinician, Psychoeducation, Recreational therapy.   Physician Treatment Plan for Primary Diagnosis: Bipolar 1 disorder, mixed, severe (HCC) Long Term Goal(s):     Short Term Goals:    Medication Management: Evaluate patient's response, side effects, and tolerance of medication regimen.  Therapeutic Interventions: 1 to 1 sessions, Unit Group sessions and Medication administration.  Evaluation of Outcomes: Not Progressing  Physician Treatment Plan for Secondary Diagnosis: Principal Problem:   Bipolar 1 disorder, mixed, severe (HCC) Active Problems:   Intellectual disability  Long Term  Goal(s):     Short Term Goals:       Medication Management: Evaluate patient's response, side effects, and tolerance of medication regimen.  Therapeutic Interventions: 1 to 1 sessions, Unit Group sessions and Medication administration.  Evaluation of Outcomes: Not Progressing   RN Treatment Plan for Primary Diagnosis: Bipolar 1 disorder, mixed, severe (HCC) Long Term Goal(s): Knowledge of disease and therapeutic regimen to maintain health will improve  Short Term Goals: Ability to remain free from injury will improve, Ability to verbalize frustration and anger appropriately will improve, Ability to demonstrate self-control, Ability to participate in decision making will improve, Ability to verbalize feelings will improve, Ability to disclose and discuss suicidal ideas, Ability to identify and develop effective coping behaviors will improve, and Compliance with prescribed medications will improve  Medication Management: RN will administer medications as ordered by provider, will assess and evaluate patient's response and provide education to patient for prescribed medication. RN will report any adverse and/or side effects to prescribing provider.  Therapeutic Interventions: 1 on 1 counseling sessions, Psychoeducation, Medication administration, Evaluate responses to treatment, Monitor vital signs and CBGs as ordered, Perform/monitor CIWA, COWS, AIMS and Fall Risk screenings as ordered, Perform wound care treatments as ordered.  Evaluation of Outcomes: Not Progressing   LCSW Treatment Plan for Primary Diagnosis: Bipolar 1 disorder, mixed, severe (HCC) Long Term Goal(s): Safe transition to appropriate next level of care at discharge, Engage patient in therapeutic group addressing interpersonal concerns.  Short Term Goals: Engage patient in aftercare planning with referrals and resources, Increase social support, Increase ability to appropriately verbalize feelings, Increase emotional  regulation, Facilitate acceptance of mental health diagnosis and concerns, Facilitate patient progression through stages of change regarding substance use diagnoses and concerns, Identify triggers associated with mental health/substance abuse issues, and Increase skills for wellness and recovery  Therapeutic Interventions: Assess for all discharge needs, 1 to 1 time with Social worker, Explore available resources and support systems, Assess for adequacy in community support network, Educate family and significant other(s) on suicide prevention, Complete Psychosocial Assessment, Interpersonal group therapy.  Evaluation of Outcomes: Not Progressing   Progress in Treatment: Attending groups: attended some groups Participating in groups: Yes. Taking medication as prescribed: Yes. Toleration medication: Yes. Family/Significant other contact made: Yes, individual(s) contacted:  Carlynn Margo, girlfriend  [phone] Patient understands diagnosis: Yes. Discussing patient identified problems/goals with staff: Yes. Medical problems stabilized or resolved: Yes. Denies suicidal/homicidal ideation: Yes. Issues/concerns per patient self-inventory: No.  New problem(s) identified:  No  New Short Term/Long Term Goal(s):     medication stabilization, elimination of SI thoughts, development of comprehensive mental wellness plan.   Patient Goals:  I want to be more stable.    Discharge Plan or Barriers:  Patient recently admitted. CSW will  continue to follow and assess for appropriate referrals and possible discharge planning.     Reason for Continuation of Hospitalization: Medication stabilization Suicidal ideation  Estimated Length of Stay:  5 - 7 days  Last 3 [place] Suicide Severity Risk Score: Flowsheet Row [facility] Most recent reading at 11/13/2023  5:00 PM ED from 11/12/2023 in [facility] Most recent reading at 11/12/2023 10:46 PM ED from 11/12/2023 in [facility] Most recent reading at 11/12/2023  4:50 PM  C-SSRS RISK CATEGORY High Risk High Risk High Risk    Last PHQ 2/9 Scores:     No data to display          Scribe for Treatment Team: Kazia Grisanti O Maggie Dworkin, LCSWA 11/14/2023 7:01 PM
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
## 2023-11-14 NOTE — Group Note (Signed)
 Date:  11/14/2023 Time:  10:40 PM  Group Topic/Focus:  Wrap-Up Group:   The focus of this group is to help patients review their daily goal of treatment and discuss progress on daily workbooks.    Participation Level:  Active  Participation Quality:  Appropriate and Sharing  Affect:  Appropriate  Cognitive:  Appropriate  Insight: Appropriate and Limited  Engagement in Group:  Distracting, Engaged, and Off Topic  Modes of Intervention:  Activity and Socialization  Additional Comments:  Patient completed wrap up group sheet. Patient rated his day a 7 out of 10. Patient goal for today, fix meds. Patient somewhat achieve goal. Coping skills the patient finds most helpful, talking. Something the patient likes about himself, good person.  Mark [name] 11/14/2023, 10:40 PM
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
## 2023-11-14 NOTE — Plan of Care (Signed)
   Problem: Activity: Goal: Interest or engagement in activities will improve Outcome: Progressing   Problem: Health Behavior/Discharge Planning: Goal: Compliance with treatment plan for underlying cause of condition will improve Outcome: Progressing   Problem: Safety: Goal: Periods of time without injury will increase Outcome: Progressing

## 2023-11-15 MED ORDER — FLUOXETINE HCL 10 MG PO CAPS
10.0000 mg | ORAL_CAPSULE | Freq: Every day | ORAL | Status: DC
  Administered 2023-11-16: 10 mg via ORAL
  Filled 2023-11-15: qty 1

## 2023-11-15 MED ORDER — LURASIDONE HCL 20 MG PO TABS
40.0000 mg | ORAL_TABLET | Freq: Every day | ORAL | Status: DC
  Administered 2023-11-15 – 2023-11-16 (×2): 40 mg via ORAL
  Filled 2023-11-15 (×2): qty 2

## 2023-11-15 NOTE — BHH Group Notes (Signed)
 Adult Psychoeducational Group Note  Date:  11/15/2023 Time:  9:20 PM  Group Topic/Focus:  Wrap-Up Group:   The focus of this group is to help patients review their daily goal of treatment and discuss progress on daily workbooks.  Participation Level:  Active  Participation Quality:  Attentive  Affect:  Appropriate  Cognitive:  Alert  Insight: Appropriate  Engagement in Group:  Engaged  Modes of Intervention:  Discussion  Additional Comments:  Patient attended and participated in the Wrap-up group.  Mark [name] 11/15/2023, 9:20 PM

## 2023-11-15 NOTE — BHH Group Notes (Signed)
 Adult Psychoeducational Group Note  Date:  11/15/2023 Time:  9:50 AM  Group Topic/Focus:  Goals Group:   The focus of this group is to help patients establish daily goals to achieve during treatment and discuss how the patient can incorporate goal setting into their daily lives to aide in recovery.  Orientation:   The focus of this group is to educate the patient on the purpose and policies of crisis stabilization and provide a format to answer questions about their admission.  The group details unit policies and expectations of patients while admitted.  Participation Level:  Did Not Attend   Additional Comments:  Pt was invited but did not attend orientation/goals group.  Niel CHRISTELLA Nightingale 11/15/2023, 9:50 AM

## 2023-11-15 NOTE — Plan of Care (Signed)
  Problem: Coping: Goal: Ability to verbalize frustrations and anger appropriately will improve Outcome: Progressing Goal: Ability to demonstrate self-control will improve Outcome: Progressing   Problem: Coping: Goal: Ability to demonstrate self-control will improve Outcome: Progressing

## 2023-11-15 NOTE — Progress Notes (Addendum)
 D. Pt continues to be labile at times, loud, but redirectable, visible in the milieu, observed attending groups-  Pt currently denies SI/HI and AVH and doesn't appear to be responding to internal stimuli.   A. Labs and vitals monitored. Pt given and educated on medications. Pt supported emotionally and encouraged to express concerns and ask questions.   R. Pt remains safe with 15 minute checks. Will continue POC.    11/15/23 1000  Psych Admission Type (Psych Patients Only)  Admission Status Voluntary  Psychosocial Assessment  Patient Complaints Depression  Eye Contact Fair  Facial Expression Animated  Affect Appropriate to circumstance  Speech Logical/coherent  Interaction Assertive  Motor Activity Slow  Appearance/Hygiene Unremarkable  Behavior Characteristics Cooperative  Mood Pleasant  Thought Process  Coherency WDL  Content WDL  Delusions None reported or observed  Perception WDL  Hallucination None reported or observed  Judgment Limited  Confusion None  Danger to Self  Current suicidal ideation? Denies  Self-Injurious Behavior No self-injurious ideation or behavior indicators observed or expressed   Agreement Not to Harm Self Yes  Description of Agreement agreed to contact staff before acting on harmful thoughts

## 2023-11-15 NOTE — Progress Notes (Signed)
(  Sleep Hours) - 8.5 (Any PRNs that were needed, meds refused, or side effects to meds)- n/a  (Any disturbances and when (visitation, over night)- n/a  (Concerns raised by the patient)- n/a  (SI/HI/AVH)- denies

## 2023-11-15 NOTE — Plan of Care (Signed)
   Problem: Education: Goal: Emotional status will improve Outcome: Progressing Goal: Mental status will improve Outcome: Progressing   Problem: Activity: Goal: Interest or engagement in activities will improve Outcome: Progressing

## 2023-11-15 NOTE — Group Note (Signed)
 LCSW Group Therapy Note   Group Date: 11/15/2023 Start Time: 1100 End Time: 1200   Participation:  patient was present and actively participated in the discussion.  He shared personal experiences.  Type of Therapy:  Group Therapy  Topic:  Healing Hearts:  A Safe Space for Grief  Objective:  The objective of this class, Healing Hearts: A Safe Space for Grief, is to create a compassionate environment where participants can process their grief, explore different stages of grief, and discover ways to honor their loved ones through personal rituals.  3 Goals: Provide a safe and supportive space where participants feel comfortable sharing their feelings and experiences of grief without judgment. Educate participants about the stages of grief and emphasize that there is no right way to grieve or a fixed timeline for healing. Introduce the concept of rituals as a means to process grief, allowing individuals to honor their loved ones in a personal and meaningful way.  Summary:  In Healing Hearts: A Safe Space for Grief, we explored the unique and personal journey of grief, emphasizing that everyone experiences it differently. We discussed the five stages of grief (denial, anger, bargaining, depression, and acceptance), with the understanding that grief is not linear. Rituals were introduced as a way to help cope with loss, offering comfort and connection through meaningful actions such as lighting candles or taking memory walks. Participants were encouraged to express their emotions, focus on self-care, and reflect on moments of gratitude for their loved ones, recognizing that healing is a process and there is no timeline for grief.  Therapeutic Modalities: Psychoeducation:  5 Stages of Grief Elements of DBT:  mindfulness and grounding for distress tolerance   Bridger Pizzi O Hessie Varone, LCSWA 11/15/2023  1:09 PM

## 2023-11-15 NOTE — Progress Notes (Signed)
   11/15/23 2100  Psych Admission Type (Psych Patients Only)  Admission Status Voluntary  Psychosocial Assessment  Patient Complaints Depression  Eye Contact Fair  Facial Expression Animated  Affect Appropriate to circumstance  Speech Logical/coherent  Interaction Assertive  Motor Activity Slow  Appearance/Hygiene Unremarkable  Behavior Characteristics Cooperative  Mood Pleasant  Thought Process  Coherency WDL  Content WDL  Delusions None reported or observed  Perception WDL  Hallucination None reported or observed  Judgment Limited  Confusion None  Danger to Self  Current suicidal ideation? Denies  Self-Injurious Behavior No self-injurious ideation or behavior indicators observed or expressed   Agreement Not to Harm Self Yes  Description of Agreement Verbal  Danger to Others  Danger to Others None reported or observed

## 2023-11-15 NOTE — Progress Notes (Signed)
 [facility] MD Progress Note  11/15/2023 4:25 PM Mark [name]  MRN:  [id number]  Principal Problem: Bipolar 1 disorder, mixed, severe (HCC) Diagnosis: Principal Problem:   Bipolar 1 disorder, mixed, severe (HCC) Active Problems:   Intellectual disability    Reason for Admission:  33 year old male with intellectual disability and Dx of bipolar disorder, with numerous BH admissions for SI. He is currently admitted to [facility] voluntarily for SI after argument with significant other. He has a legal guardian.    Information Obtained Today During Patient Interview:  Patient is pleasant and talkative. He enjoys being in groups and spending time with other patients. He asks several questions about medications. His thought process is somewhat concrete. He is opposed to Prozac  for various reasons and wants to stop the medication.   He denies having thoughts of self harm. He says his mood is good and he is sleeping well. We talked about just walking away when he gets angry. The patient says he intends to practice this. He denies any problematic physical symptoms except for chronic knee pain.  Total Time spent with patient: 20 min  Past Psychiatric History: as per H and P  Past Medical History:  Past Medical History:  Diagnosis Date   ADHD (attention deficit hyperactivity disorder)    Bipolar 1 disorder (HCC)    Mental retardation     Past Surgical History:  Procedure Laterality Date   NO PAST SURGERIES     Family History:  Family History  Problem Relation Age of Onset   Hypertension Mother    Hyperlipidemia Mother    Family Psychiatric  History: as per H and P Social History:  Social History   Substance and Sexual Activity  Alcohol Use No     Social History   Substance and Sexual Activity  Drug Use Yes   Types: Marijuana    Social History   Socioeconomic History   Marital status: Single    Spouse name: Not on file   Number of children: Not on file   Years of education: Not  on file   Highest education level: Not on file  Occupational History   Not on file  Tobacco Use   Smoking status: Every Day    Current packs/day: 1.00    Average packs/day: 1 pack/day for 0.7 years (0.7 ttl pk-yrs)    Types: Cigarettes    Start date: 2025   Smokeless tobacco: Not on file  Substance and Sexual Activity   Alcohol use: No   Drug use: Yes    Types: Marijuana   Sexual activity: Yes  Other Topics Concern   Not on file  Social History Narrative   ** Merged History Encounter **       Social Drivers of Health   Financial Resource Strain: Low Risk  (02/20/2023)   Received from [facility]   Overall Financial Resource Strain (CARDIA)    Difficulty of Paying Living Expenses: Not hard at all  Food Insecurity: No Food Insecurity (11/13/2023)   Hunger Vital Sign    Worried About Running Out of Food in the Last Year: Never true    Ran Out of Food in the Last Year: Never true  Transportation Needs: No Transportation Needs (11/13/2023)   PRAPARE - Administrator, Civil Service (Medical): No    Lack of Transportation (Non-Medical): No  Physical Activity: Sufficiently Active (02/20/2023)   Received from [facility]   Exercise Vital Sign    On average,  how many days per week do you engage in moderate to strenuous exercise (like a brisk walk)?: 5 days    On average, how many minutes do you engage in exercise at this level?: 120 min  Stress: Stress Concern Present (02/20/2023)   Received from [facility] - Occupational Stress Questionnaire    Feeling of Stress : To some extent  Social Connections: Somewhat Isolated (02/20/2023)   Received from [facility]   Social Network    How would you rate your social network (family, work, friends)?: Restricted participation with some degree of social isolation   Additional Social History:                         Sleep: fair  Appetite: fair  Current  Medications: Current Facility-Administered Medications  Medication Dose Route Frequency Provider Last Rate Last Admin   acetaminophen  (TYLENOL ) tablet 650 mg  650 mg Oral Q6H PRN Wise, James Benjamin Stallworth, MD       alum & mag hydroxide-simeth (MAALOX/MYLANTA) 200-200-20 MG/5ML suspension 30 mL  30 mL Oral Q4H PRN Delsie Lynwood Morene Lavone, MD       amoxicillin -clavulanate (AUGMENTIN ) 875-125 MG per tablet 1 tablet  1 tablet Oral BID Prunty, Donald B, DO   1 tablet at 11/15/23 9262   haloperidol  (HALDOL ) tablet 5 mg  5 mg Oral TID PRN Delsie Lynwood Morene Lavone, MD       And   diphenhydrAMINE  (BENADRYL ) capsule 50 mg  50 mg Oral TID PRN Delsie Lynwood Morene Lavone, MD       haloperidol  lactate (HALDOL ) injection 5 mg  5 mg Intramuscular TID PRN Delsie Lynwood Morene Lavone, MD       And   diphenhydrAMINE  (BENADRYL ) injection 50 mg  50 mg Intramuscular TID PRN Delsie Lynwood Morene Lavone, MD       And   LORazepam  (ATIVAN ) injection 2 mg  2 mg Intramuscular TID PRN Delsie Lynwood Morene Lavone, MD       haloperidol  lactate (HALDOL ) injection 10 mg  10 mg Intramuscular TID PRN Delsie Lynwood Morene Lavone, MD       And   diphenhydrAMINE  (BENADRYL ) injection 50 mg  50 mg Intramuscular TID PRN Delsie Lynwood Morene Lavone, MD       And   LORazepam  (ATIVAN ) injection 2 mg  2 mg Intramuscular TID PRN Delsie Lynwood Morene Lavone, MD       divalproex  (DEPAKOTE  ER) 24 hr tablet 2,000 mg  2,000 mg Oral QHS Delsie Lynwood Morene Lavone, MD   2,000 mg at 11/14/23 2127   [START ON 11/16/2023] FLUoxetine  (PROZAC ) capsule 10 mg  10 mg Oral Daily Towana Leita SAILOR, MD       hydrOXYzine  (ATARAX ) tablet 10 mg  10 mg Oral TID PRN Delsie Lynwood Morene Lavone, MD       lurasidone  (LATUDA ) tablet 40 mg  40 mg Oral Q supper Towana Leita SAILOR, MD       magnesium  hydroxide (MILK OF MAGNESIA) suspension 30 mL  30 mL Oral Daily PRN Delsie Lynwood Morene Lavone, MD        nicotine  polacrilex (NICORETTE ) gum 2 mg  2 mg Oral PRN Delsie Lynwood Morene Lavone, MD   2 mg at 11/15/23 1335   traZODone  (DESYREL ) tablet 200 mg  200 mg Oral QHS Delsie Lynwood Morene Lavone, MD   200 mg at 11/14/23 2127    Lab Results:  Results for orders placed or performed during the hospital encounter of 11/13/23 (from the past 48 hours)  CBC with Differential     Status: Abnormal   Collection Time: 11/14/23  6:21 PM  Result Value Ref Range   WBC 12.3 (H) 4.0 - 10.5 K/uL   RBC 4.86 4.22 - 5.81 MIL/uL   Hemoglobin 14.3 13.0 - 17.0 g/dL   HCT 55.3 60.9 - 47.9 %   MCV 91.8 80.0 - 100.0 fL   MCH 29.4 26.0 - 34.0 pg   MCHC 32.1 30.0 - 36.0 g/dL   RDW 87.4 88.4 - 84.4 %   Platelets 305 150 - 400 K/uL   nRBC 0.0 0.0 - 0.2 %   Neutrophils Relative % 57 %   Neutro Abs 6.9 1.7 - 7.7 K/uL   Lymphocytes Relative 27 %   Lymphs Abs 3.4 0.7 - 4.0 K/uL   Monocytes Relative 11 %   Monocytes Absolute 1.4 (H) 0.1 - 1.0 K/uL   Eosinophils Relative 3 %   Eosinophils Absolute 0.4 0.0 - 0.5 K/uL   Basophils Relative 1 %   Basophils Absolute 0.1 0.0 - 0.1 K/uL   Immature Granulocytes 1 %   Abs Immature Granulocytes 0.16 (H) 0.00 - 0.07 K/uL    Comment: Performed at [facility], [address] [place], [place] [address]    Blood Alcohol level:  Lab Results  Component Value Date   [facility] <15 11/12/2023   ETH <15 11/02/2023    Metabolic Disorder Labs: Lab Results  Component Value Date   HGBA1C 5.2 11/05/2023   MPG 102.54 11/05/2023   MPG 154 09/04/2014   No results found for: PROLACTIN Lab Results  Component Value Date   CHOL 185 11/06/2023   TRIG 148 11/06/2023   HDL 40 (L) 11/06/2023   CHOLHDL 4.6 11/06/2023   VLDL 30 11/06/2023   LDLCALC 115 (H) 11/06/2023   LDLCALC 77 11/05/2023    Physical Findings:   Psychiatric Specialty Exam: Physical Exam Constitutional:      Appearance: the patient is not toxic-appearing.  Pulmonary:     Effort:  Pulmonary effort is normal.  Neurological:     General: No focal deficit present.     Mental Status: the patient is alert and oriented to person, place, and time.   Review of Systems  Respiratory:  Negative for shortness of breath.   Cardiovascular:  Negative for chest pain.  Gastrointestinal:  Negative for abdominal pain, constipation, diarrhea, nausea and vomiting.  Neurological:  Negative for headaches.      BP 129/73 (BP Location: Right Arm)   Pulse 76   Temp 97.8 F (36.6 C) (Oral)   Resp 16   Ht 6' 2 (1.88 m)   Wt 86.2 kg   SpO2 99%   BMI 24.39 kg/m   General Appearance: Fairly Groomed  Eye Contact:  Good  Speech:  Clear and Coherent  Volume:  Normal  Mood:  Euthymic  Affect:  Congruent  Thought Process:  Coherent  Orientation:  Full (Time, Place, and Person)  Thought Content: Logical   Suicidal Thoughts:  No  Homicidal Thoughts:  No  Memory:  Immediate;   Good  Judgement:  fair  Insight:  fair  Psychomotor Activity:  Normal  Concentration:  Concentration: Good  Recall:  Good  Fund of Knowledge: poor  Language: Good  Akathisia:  No  Handed:  not assessed  AIMS (if indicated): not done  Assets:  Communication Skills Desire for Improvement  Financial Resources/Insurance Housing Leisure Time Physical Health  ADL's:  Intact  Cognition: limited  Sleep:  Fair      Treatment Plan Summary: Daily contact with patient to assess and evaluate symptoms and progress in treatment and Medication management   Dx is intellectual disability and historical Dx of BPAD 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

## 2023-11-15 NOTE — Group Note (Signed)
 Occupational Therapy Group Note  Group Topic: Sleep Hygiene  Group Date: 11/15/2023 Start Time: 1500 End Time: 1530 Facilitators: Dot Dallas MATSU, OT   Group Description: Group encouraged increased participation and engagement through topic focused on sleep hygiene. Patients reflected on the quality of sleep they typically receive and identified areas that need improvement. Group was given background information on sleep and sleep hygiene, including common sleep disorders. Group members also received information on how to improve one's sleep and introduced a sleep diary as a tool that can be utilized to track sleep quality over a length of time. Group session ended with patients identifying one or more strategies they could utilize or implement into their sleep routine in order to improve overall sleep quality.        Therapeutic Goal(s):  Identify one or more strategies to improve overall sleep hygiene  Identify one or more areas of sleep that are negatively impacted (sleep too much, too little, etc)     Participation Level: Engaged   Participation Quality: Independent   Behavior: Appropriate   Speech/Thought Process: Relevant   Affect/Mood: Appropriate   Insight: Fair   Judgement: Fair      Modes of Intervention: Education  Patient Response to Interventions:  Attentive   Plan: Continue to engage patient in OT groups 2 - 3x/week.  11/15/2023  D[place] MATSU Dot, OT   Mark [name], OT

## 2023-11-16 NOTE — Group Note (Signed)
 Date:  11/16/2023 Time:  4:14 PM  Group Topic/Focus:  Medication Side effect    Participation Level:  Active  Participation Quality:  Appropriate  Affect:  Appropriate  Cognitive:  Alert and Appropriate  Insight: Appropriate  Engagement in Group:  Engaged  Modes of Intervention:  Education    Almarie MALVA Lowers 11/16/2023, 4:14 PM

## 2023-11-16 NOTE — Plan of Care (Signed)
   Problem: Education: Goal: Knowledge of [place] [facility] Education information/materials will improve Outcome: Progressing Goal: Emotional status will improve Outcome: Progressing Goal: Mental status will improve Outcome: Progressing

## 2023-11-16 NOTE — Plan of Care (Signed)
  Problem: Education: Goal: Emotional status will improve Outcome: Progressing Goal: Verbalization of understanding the information provided will improve Outcome: Progressing   Problem: Activity: Goal: Interest or engagement in activities will improve Outcome: Progressing   

## 2023-11-16 NOTE — Progress Notes (Signed)
   11/16/23 1200  Psych Admission Type (Psych Patients Only)  Admission Status Voluntary  Psychosocial Assessment  Patient Complaints Depression  Eye Contact Fair  Facial Expression Animated  Affect Appropriate to circumstance  Speech Logical/coherent  Interaction Assertive  Motor Activity Slow  Appearance/Hygiene Unremarkable  Behavior Characteristics Cooperative  Mood Pleasant  Thought Process  Coherency WDL  Content WDL  Delusions None reported or observed  Perception WDL  Hallucination None reported or observed  Judgment Limited  Confusion None  Danger to Self  Current suicidal ideation? Denies  Description of Suicide Plan No Plan  Agreement Not to Harm Self Yes  Description of Agreement Verbal  Danger to Others  Danger to Others None reported or observed

## 2023-11-16 NOTE — Group Note (Signed)
 Recreation Therapy Group Note   Group Topic:Team Building  Group Date: 11/16/2023 Start Time: 0935 End Time: 1005 Facilitators: Sandra Brents-McCall, LRT,CTRS Location: [address] Day[address]   Group Topic: Communication, Team Building, Problem Solving  Goal Area(s) Addresses:  Patient will effectively work with peer towards shared goal.  Patient will identify skills used to make activity successful.  Patient will share challenges and verbalize solution-driven approaches used. Patient will identify how skills used during activity can be used to reach post d/c goals.   Behavioral Response:   Intervention: STEM Activity   Activity: [employer]. Patients were provided the following materials: 4 drinking straws, 5 rubber bands, 5 paper clips, 2 index cards and 2 drinking cups. Using the provided materials patients were asked to build a launching mechanism to launch a ping pong ball across the room, approximately 10 feet. Patients were divided into teams of 3-5. Instructions required all materials be incorporated into the device, functionality of items left to the peer group's discretion.  Education: Pharmacist, community, Scientist, physiological, Air cabin crew, Building control surveyor.   Education Outcome: Acknowledges education/In group clarification    Affect/Mood: N/A   Participation Level: Did not attend    Clinical Observations/Individualized Feedback:      Plan: Continue to engage patient in RT group sessions 2-3x/week.   Niels Cranshaw-McCall, LRT,CTRS 11/16/2023 12:48 PM

## 2023-11-16 NOTE — Plan of Care (Signed)
   Problem: Education: Goal: Emotional status will improve Outcome: Progressing Goal: Mental status will improve Outcome: Progressing Goal: Verbalization of understanding the information provided will improve Outcome: Progressing

## 2023-11-16 NOTE — Group Note (Signed)
 Date:  11/16/2023 Time:  9:33 AM  Group Topic/Focus:  Goals Group:   The focus of this group is to help patients establish daily goals to achieve during treatment and discuss how the patient can incorporate goal setting into their daily lives to aide in recovery.    Participation Level:  Did Not Attend  Participation Quality:  na  Affect:  na  Cognitive:  na  Insight: None  Engagement in Group:  na  Modes of Intervention:  na  Additional Comments:  na  Nat Rummer 11/16/2023, 9:33 AM

## 2023-11-16 NOTE — Progress Notes (Signed)
 [facility] Inpatient Psychiatry Progress Note  Date: 11/16/2023 Patient: Mark [name] MRN: [id number]   Subjective  33 year old male with intellectual disability and Dx of bipolar disorder, with numerous BH admissions for SI. He is currently admitted to [facility] voluntarily for SI after argument with significant other. He has a legal guardian.   11/16/2023  No significant events overnight. MAR was reviewed and patient was compliant with medications yesterday. They received no PRN medications yesterday. Case was discussed in the multidisciplinary team.   On interview patient reports that his fiance has broken up with him.  He appears to be significantly more depressed today, and is trying to sleep up most of the day because his thoughts have been racing regarding the break-up.  He has a positive long-term outlook, with confidence that he will get over this but he recognizes that will take some time.  He is unsure if he wants to continue dating, but has not had a change to his suicidal thoughts.  They are still on and off as they were before.  Patient expects that he will have a breakdown soon because of this new stressor. Patient is also interested in coming off the Prozac  because he heard that it is not beneficial for bipolar diagnosis.  Discussed plan with patient to which they were agreeable. Pt questions were answered.   Objective  Vitals: Blood pressure (!) 116/57, pulse 74, temperature 97.8 F (36.6 C), temperature source Oral, resp. rate 18, height 6' 2 (1.88 m), weight 86.2 kg, SpO2 100%.   Physical Examination:  Vitals and nursing note reviewed  Musculoskeletal:  Strength & Muscle Tone: within normal limits Gait & Station: normal   Mental Status Exam  Apperance: Appropriate for environment, Casual, and Sitting upright Behavior: Calm Speech: Normal Rate, Articulate, Normal Volume, and Responsive Attitude: Cooperative and Friendly Mood: I'm  depressed Affect: DEPRESSED, Normal Range, and Mood Congruent Perception: Not responding to internal stimuli Thought Content: within normal limits Thought Form: Goal Directed, Organized, Linear, and Logical Cognition: Alert & Oriented to person, place, and time, Recent and Remote memory grossly intact by recounting personal history, and Immediate memory grossly intact by interview Judgment: Fair Insight: Fair    Lab Results:   Admission on 11/13/2023  Component Date Value Ref Range Status   WBC 11/14/2023 12.3 (H)  4.0 - 10.5 K/uL Final   RBC 11/14/2023 4.86  4.22 - 5.81 MIL/uL Final   Hemoglobin 11/14/2023 14.3  13.0 - 17.0 g/dL Final   HCT 90/96/7974 44.6  39.0 - 52.0 % Final   MCV 11/14/2023 91.8  80.0 - 100.0 fL Final   MCH 11/14/2023 29.4  26.0 - 34.0 pg Final   MCHC 11/14/2023 32.1  30.0 - 36.0 g/dL Final   RDW 90/96/7974 12.5  11.5 - 15.5 % Final   Platelets 11/14/2023 305  150 - 400 K/uL Final   nRBC 11/14/2023 0.0  0.0 - 0.2 % Final   Neutrophils Relative % 11/14/2023 57  % Final   Neutro Abs 11/14/2023 6.9  1.7 - 7.7 K/uL Final   Lymphocytes Relative 11/14/2023 27  % Final   Lymphs Abs 11/14/2023 3.4  0.7 - 4.0 K/uL Final   Monocytes Relative 11/14/2023 11  % Final   Monocytes Absolute 11/14/2023 1.4 (H)  0.1 - 1.0 K/uL Final   Eosinophils Relative 11/14/2023 3  % Final   Eosinophils Absolute 11/14/2023 0.4  0.0 - 0.5 K/uL Final   Basophils Relative 11/14/2023 1  %  Final   Basophils Absolute 11/14/2023 0.1  0.0 - 0.1 K/uL Final   Immature Granulocytes 11/14/2023 1  % Final   Abs Immature Granulocytes 11/14/2023 0.16 (H)  0.00 - 0.07 K/uL Final      Assessment & Plan  33 year old male with intellectual disability and Dx of bipolar disorder, with numerous BH admissions for SI. He is currently admitted to [facility] voluntarily for SI after argument with significant other. He has a legal guardian.   Suicide Risk - Mild: Fleeting suicidal ideation comes and goes without  specificity.  Patient has long-term plans and overall positive outlook on life despite recent negative stressor.  He has decent cell control and multiple protective factors including social support, family support, resilience, financial stability, housing.  # Bipolar 1 disorder, mixed, severe # IDD Patient has a long history of bipolar 1 disorder.  Personal history is difficult to assess severity of it with the IDD, however it appears to be relatively well-controlled.  However his recent depressed mood may be consistent with a depressive episode of bipolar 1.  Will start by continuing home medications and helping patient with the therapeutic milieu of the unit and continue to reassess. - Continue home trazodone  200 mg nightly - Continue home Depakote  2 g nightly - Continue lurasidone  40 mg with dinner. - Stop Prozac  10 mg on 9/6, last dose 9/5.    Psychiatry General PRNs             -- Hydroxyzine  10 mg 3 times daily as needed for anxiety             -- Agitation protocol: Haldol , Benadryl , lorazepam     Medical Issues Being Addressed:  #Dog Bite             -Continue Augmentin  through 9/6 as prescribed by the ED doc evaluating the dog bite. -Repeat CBC   Medical General PRNs             - Tylenol  tablets 650 mg every 6 hours as needed for pain - Maalox/Mylanta suspension 30 mL every 4 hours as needed for indigestion - Milk of Magnesia 30 mL daily as needed for constipation     Discharge Planning:              -- Social work and case management to assist with discharge planning and identification of hospital follow-up needs prior to discharge             -- Estimated LOS: 5-7 days             -- Discharge Concerns: Need to establish a safety plan; Medication compliance and effectiveness             -- Discharge Goals: Return home with outpatient referrals for mental health follow-up including medication management/psychotherapy     Safety and Monitoring:             -- Voluntary  admission to inpatient psychiatric unit for safety, stabilization and treatment             -- Daily contact with patient to assess and evaluate symptoms and progress in treatment             -- Patient's case to be discussed in multi-disciplinary team meeting             -- Observation Level : q15 minute checks             -- Vital signs:  q12 hours             --  Precautions: suicide, elopement, and assault   Group Therapy Encouraged patient to participate in unit milieu and in scheduled group therapies  -- Short Term Goals: Ability to identify changes in lifestyle to reduce recurrence of condition will improve, Ability to verbalize feelings will improve, Ability to disclose and discuss suicidal ideas, Ability to demonstrate self-control will improve, Ability to identify and develop effective coping behaviors will improve, Ability to maintain clinical measurements within normal limits will improve, Compliance with prescribed medications will improve, and Ability to identify triggers associated with substance abuse/mental health issues will improve. -- Long Term Goals: Improvement in symptoms so as ready for discharge.   The risks/benefits/side-effects/alternatives to medication were discussed in detail with the patient and time was given for questions. The patient consents to medication trial.    DSABRA Penne Mori Psychiatry  PGY-1 11/16/2023, 10:19 AM

## 2023-11-16 NOTE — BHH Group Notes (Signed)
 Adult Psychoeducational Group Note  Date:  11/16/2023 Time:  9:02 PM  Group Topic/Focus:  Wrap-Up Group:   The focus of this group is to help patients review their daily goal of treatment and discuss progress on daily workbooks.  Participation Level:  Active  Participation Quality:  Appropriate  Affect:  Appropriate  Cognitive:  Appropriate  Insight: Appropriate  Engagement in Group:  Engaged  Modes of Intervention:  Discussion  Additional Comments:  Mark [name] was a 7. His goal be more self love and honest. He met goal. Coping skills counting on fingers talking deep breathing .Favoriet part of the [name] being around friends. Something to [name] continue recorvery thereapy. Something he think surprise dating black women.  Mark [name] 11/16/2023, 9:02 PM

## 2023-11-17 MED ORDER — LURASIDONE HCL 20 MG PO TABS
60.0000 mg | ORAL_TABLET | Freq: Every day | ORAL | Status: DC
  Administered 2023-11-17 – 2023-11-19 (×3): 60 mg via ORAL
  Filled 2023-11-17 (×3): qty 3

## 2023-11-17 NOTE — Group Note (Signed)
 Date:  11/17/2023 Time:  4:25 PM  Group Topic/Focus:  Managing Feelings:   The focus of this group is to identify what feelings patients have difficulty handling and develop a plan to handle them in a healthier way upon discharge.    Participation Level:  Active  Participation Quality:  Appropriate and Attentive  Affect:  Appropriate  Cognitive:  Alert and Appropriate  Insight: Appropriate  Engagement in Group:  Engaged  Modes of Intervention:  Activity, Discussion, and Education   Mark [name] 11/17/2023, 4:25 PM

## 2023-11-17 NOTE — Plan of Care (Signed)
   Problem: Education: Goal: Knowledge of [place] [facility] Education information/materials will improve Outcome: Progressing Goal: Emotional status will improve Outcome: Progressing Goal: Mental status will improve Outcome: Progressing

## 2023-11-17 NOTE — Progress Notes (Addendum)
 [facility] MD Progress Note  11/17/2023 9:43 AM Mark [name]  MRN:  [id number] Principal Problem: Bipolar 1 disorder, mixed, severe (HCC) Diagnosis: Principal Problem:   Bipolar 1 disorder, mixed, severe (HCC) Active Problems:   Intellectual disability  Total Time spent with patient: 30 minutes  Mark [name] is a 33 year old male with a history of IDD, bipolar disorder, ADHD, PTSD, suicidal ideation. Presented to [facility] via [facility] for worsening suicidal ideation.    Case was discussed in the multidisciplinary team. MAR was reviewed and patient is compliant with medications.   Psychiatric Team made the following recommendations yesterday: Start Prozac  10 mg    On interview today patient reports he slept good last night.  He reports his appetite is too good.  He reports no SI, HI, or AVH.  He reports no paranoia or ideas of reference.  He he reports increased agitation and anxiety, due to recent phone calls with his fiance.  He states he took his fiance off the call list, as she is making him feel more agitated.  Patient is amenable to increase dosage and Latuda .  Patient reports no issues with his medication  Past Psychiatric History:  Previous psychiatric diagnoses: IDD, MDD, bipolar disorder, impulse control disorder, PTSD, and ADHD Prior psychiatric treatment: Trial of trazodone , Depakote , gabapentin , hydroxyzine , Topamax  Psychiatric medication compliance history: Noncompliant  Current psychiatric treatment: Depakote  ER 500 mg, Latuda  20 mg, trazodone  100 mg Current psychiatrist: Prentice Espy, MD Current therapist: N/A  Previous hospitalizations [facility] 8/25 for SI.  History of suicide attempts: 7 previous attempts in adolescence   Past Medical History:  Past Medical History:  Diagnosis Date   ADHD (attention deficit hyperactivity disorder)    Bipolar 1 disorder (HCC)    Mental retardation     Past Surgical History:  Procedure Laterality Date   NO PAST SURGERIES      Family History:  Family History  Problem Relation Age of Onset   Hypertension Mother    Hyperlipidemia Mother    Family Psychiatric  History: Patient unsure Social History:  Social History   Substance and Sexual Activity  Alcohol Use No     Social History   Substance and Sexual Activity  Drug Use Yes   Types: Marijuana    Social History   Socioeconomic History   Marital status: Single    Spouse name: Not on file   Number of children: Not on file   Years of education: Not on file   Highest education level: Not on file  Occupational History   Not on file  Tobacco Use   Smoking status: Every Day    Current packs/day: 1.00    Average packs/day: 1 pack/day for 0.7 years (0.7 ttl pk-yrs)    Types: Cigarettes    Start date: 2025   Smokeless tobacco: Not on file  Substance and Sexual Activity   Alcohol use: No   Drug use: Yes    Types: Marijuana   Sexual activity: Yes  Other Topics Concern   Not on file  Social History Narrative   ** Merged History Encounter **       Social Drivers of Health   Financial Resource Strain: Low Risk  (02/20/2023)   Received from [facility]   Overall Financial Resource Strain (CARDIA)    Difficulty of Paying Living Expenses: Not hard at all  Food Insecurity: No Food Insecurity (11/13/2023)   Hunger Vital Sign    Worried About Running Out of Food in the Last Year:  Never true    Ran Out of Food in the Last Year: Never true  Transportation Needs: No Transportation Needs (11/13/2023)   PRAPARE - Administrator, Civil Service (Medical): No    Lack of Transportation (Non-Medical): No  Physical Activity: Sufficiently Active (02/20/2023)   Received from [facility]   Exercise Vital Sign    On average, how many days per week do you engage in moderate to strenuous exercise (like a brisk walk)?: 5 days    On average, how many minutes do you engage in exercise at this level?: 120 min  Stress: Stress Concern Present  (02/20/2023)   Received from [facility] - Occupational Stress Questionnaire    Feeling of Stress : To some extent  Social Connections: Somewhat Isolated (02/20/2023)   Received from [facility]   Social Network    How would you rate your social network (family, work, friends)?: Restricted participation with some degree of social isolation   Additional Social History:                         Sleep: Good Estimated Sleeping Duration (Last 24 Hours): 7.25-7.50 hours  Appetite:  Good  Current Medications: Current Facility-Administered Medications  Medication Dose Route Frequency Provider Last Rate Last Admin   acetaminophen  (TYLENOL ) tablet 650 mg  650 mg Oral Q6H PRN Wise, James Benjamin Stallworth, MD       alum & mag hydroxide-simeth (MAALOX/MYLANTA) 200-200-20 MG/5ML suspension 30 mL  30 mL Oral Q4H PRN Delsie Lynwood Morene Lavone, MD       amoxicillin -clavulanate (AUGMENTIN ) 875-125 MG per tablet 1 tablet  1 tablet Oral BID Prunty, Donald B, DO   1 tablet at 11/17/23 9177   haloperidol  (HALDOL ) tablet 5 mg  5 mg Oral TID PRN Delsie Lynwood Morene Lavone, MD       And   diphenhydrAMINE  (BENADRYL ) capsule 50 mg  50 mg Oral TID PRN Delsie Lynwood Morene Lavone, MD       haloperidol  lactate (HALDOL ) injection 5 mg  5 mg Intramuscular TID PRN Delsie Lynwood Morene Lavone, MD       And   diphenhydrAMINE  (BENADRYL ) injection 50 mg  50 mg Intramuscular TID PRN Delsie Lynwood Morene Lavone, MD       And   LORazepam  (ATIVAN ) injection 2 mg  2 mg Intramuscular TID PRN Delsie Lynwood Morene Lavone, MD       haloperidol  lactate (HALDOL ) injection 10 mg  10 mg Intramuscular TID PRN Delsie Lynwood Morene Lavone, MD       And   diphenhydrAMINE  (BENADRYL ) injection 50 mg  50 mg Intramuscular TID PRN Delsie Lynwood Morene Lavone, MD       And   LORazepam  (ATIVAN ) injection 2 mg  2 mg Intramuscular TID PRN Delsie Lynwood Morene Lavone, MD       divalproex  (DEPAKOTE  ER) 24 hr tablet 2,000 mg  2,000 mg Oral QHS Delsie Lynwood Morene Lavone, MD   2,000 mg at 11/16/23 2204   hydrOXYzine  (ATARAX ) tablet 10 mg  10 mg Oral TID PRN Delsie Lynwood Morene Lavone, MD   10 mg at 11/16/23 2204   lurasidone  (LATUDA ) tablet 40 mg  40 mg Oral Q supper Towana Leita SAILOR, MD   40 mg at 11/16/23 1646   magnesium  hydroxide (MILK OF MAGNESIA) suspension 30 mL  30 mL Oral Daily PRN Delsie Lynwood Morene Lavone, MD  nicotine  polacrilex (NICORETTE ) gum 2 mg  2 mg Oral PRN Delsie Lynwood Morene Lavone, MD   2 mg at 11/17/23 9177   traZODone  (DESYREL ) tablet 200 mg  200 mg Oral QHS Delsie Lynwood Morene Lavone, MD   200 mg at 11/16/23 2204    Lab Results: No results found for this or any previous visit (from the past 48 hours).  Blood Alcohol level:  Lab Results  Component Value Date   [facility] <15 11/12/2023   ETH <15 11/02/2023    Metabolic Disorder Labs: Lab Results  Component Value Date   HGBA1C 5.2 11/05/2023   MPG 102.54 11/05/2023   MPG 154 09/04/2014   No results found for: PROLACTIN Lab Results  Component Value Date   CHOL 185 11/06/2023   TRIG 148 11/06/2023   HDL 40 (L) 11/06/2023   CHOLHDL 4.6 11/06/2023   VLDL 30 11/06/2023   LDLCALC 115 (H) 11/06/2023   LDLCALC 77 11/05/2023    Physical Findings: AIMS:  ,  ,  ,  ,  ,  ,   CIWA:    COWS:     Musculoskeletal: Strength & Muscle Tone: within normal limits Gait & Station: normal Patient leans: N/A  Psychiatric Specialty Exam:  Presentation  General Appearance:  Appropriate for Environment  Eye Contact: Fair  Speech: Clear and Coherent; Normal Rate  Speech Volume: Normal  Handedness: Right   Mood and Affect  Mood: Depressed  Affect: Congruent; Appropriate; Constricted   Thought Process  Thought Processes: Coherent  Descriptions of Associations:Intact  Orientation:Full (Time, Place and  Person)  Thought Content:Logical  History of Schizophrenia/Schizoaffective disorder:No  Duration of Psychotic Symptoms:No data recorded Hallucinations:No data recorded Ideas of Reference:None  Suicidal Thoughts:No data recorded Homicidal Thoughts:No data recorded  Sensorium  Memory: Immediate Fair; Recent Fair  Judgment: Poor  Insight: Poor   Executive Functions  Concentration: Fair  Attention Span: Fair  Recall: Fair  Fund of Knowledge: Fair  Language: Fair   Psychomotor Activity  Psychomotor Activity:No data recorded  Assets  Assets: Desire for Improvement; Communication Skills; Housing; Physical Health; Social Support; Health and safety inspector   Sleep  Sleep:No data recorded   Physical Exam: Physical Exam Constitutional:      General: He is not in acute distress.    Appearance: He is normal weight. He is not ill-appearing.  HENT:     Head: Normocephalic and atraumatic.  Neurological:     General: No focal deficit present.     Mental Status: He is alert.    Review of Systems  Gastrointestinal:  Negative for abdominal pain, constipation, diarrhea, nausea and vomiting.  Neurological:  Negative for dizziness and headaches.  Psychiatric/Behavioral:  Negative for hallucinations and suicidal ideas.    Blood pressure 121/68, pulse 70, temperature 98 F (36.7 C), temperature source Oral, resp. rate 18, height 6' 2 (1.88 m), weight 86.2 kg, SpO2 98%. Body mass index is 24.39 kg/m.   Assessment/Plan:  Mark [name] is a 33 year old male with a history of IDD, bipolar disorder, ADHD, PTSD, suicidal ideation. Presented to [facility] via [facility] for worsening suicidal ideation.  He has a legal guardian.  We will plan to increase his Latuda  from 40 mg to 60 mg and monitor him.  Will plan for possible discharge on Monday.  # Bipolar 1 disorder, mixed, severe # IDD - Increase Latuda  40 mg to 60 mg with dinner -Continue home Depakote  2 g  nightly - Continue home trazodone  200 mg nightly - Atarax  25 mg TID  as needed for anxiety - Agitation Protocol: Haldol , Benadryl , Ativan   #Dog Bite  - Continue Augmentin  through 9/6 as prescribed by ED  Other as needed medications  Tylenol  650 mg every 6 hours as needed for pain Mylanta 30 mL every 4 hours as needed for indigestion Milk of magnesia 30 mL daily as needed for constipation   The risks/benefits/side-effects/alternatives to the above medication were discussed in detail with the patient and time was given for questions. The patient consents to medication trial. FDA black box warnings, if present, were discussed.  The patient is agreeable with the medication plan, as above. We will monitor the patient's response to pharmacologic treatment, and adjust medications as necessary.   I certify that inpatient services furnished can reasonably be expected to improve the patient's condition.     Alan Maiden, MD 11/17/2023, 9:43 AM

## 2023-11-17 NOTE — Progress Notes (Signed)
 Pt present on the unit. Calm and cooperative. Med compliant. Attends group. Socializes. Denies SI/HI/AVH. Pt encouraged to reach out to staff for any needs or concerns.    11/17/23 1300  Psych Admission Type (Psych Patients Only)  Admission Status Voluntary  Psychosocial Assessment  Patient Complaints None  Eye Contact Fair  Facial Expression Animated  Affect Appropriate to circumstance  Speech Logical/coherent  Interaction Assertive  Motor Activity Slow  Appearance/Hygiene Unremarkable  Behavior Characteristics Cooperative  Mood Pleasant  Thought Process  Coherency WDL  Content WDL  Delusions None reported or observed  Perception WDL  Hallucination None reported or observed  Judgment Limited  Confusion None  Danger to Self  Current suicidal ideation? Denies  Description of Suicide Plan No Plan  Self-Injurious Behavior No self-injurious ideation or behavior indicators observed or expressed   Agreement Not to Harm Self Yes  Description of Agreement Verbal  Danger to Others  Danger to Others None reported or observed

## 2023-11-17 NOTE — BHH Group Notes (Signed)
 BHH Group Notes:  (Nursing/MHT/Case Management/Adjunct)  Date:  11/17/2023  Time:  10:07 PM  Type of Therapy:  Wrap-up group  Participation Level:  Active  Participation Quality:  Appropriate  Affect:  Appropriate  Cognitive:  Appropriate  Insight:  Appropriate  Engagement in Group:  Engaged  Modes of Intervention:  Education  Summary of Progress/Problems:Goal to get better. Rated day 8/10.   Grayce LITTIE Essex 11/17/2023, 10:07 PM

## 2023-11-17 NOTE — Progress Notes (Signed)
(  Sleep Hours) -8.5 (Any PRNs that were needed, meds refused, or side effects to meds)- prn hydroxyzine  @ 2204 (Any disturbances and when (visitation, over night)-none (Concerns raised by the patient)- none (SI/HI/AVH)- Denies all

## 2023-11-17 NOTE — Plan of Care (Signed)
  Problem: Education: Goal: Emotional status will improve Outcome: Progressing Goal: Verbalization of understanding the information provided will improve Outcome: Progressing   Problem: Activity: Goal: Interest or engagement in activities will improve Outcome: Progressing   

## 2023-11-17 NOTE — Group Note (Signed)
 Date:  11/17/2023 Time:  9:13 AM  Group Topic/Focus:  Goals Group:   The focus of this group is to help patients establish daily goals to achieve during treatment and discuss how the patient can incorporate goal setting into their daily lives to aide in recovery.    Participation Level:  Did Not Attend  Participation Quality:  NA  Affect:  NA  Cognitive:  NA  Insight: None  Engagement in Group:  NA  Modes of Intervention:  NA  Additional Comments:  NA  Malonie Tatum A Kallen Delatorre 11/17/2023, 9:13 AM

## 2023-11-18 MED ORDER — HYDROXYZINE HCL 10 MG PO TABS
10.0000 mg | ORAL_TABLET | Freq: Every morning | ORAL | Status: DC
  Administered 2023-11-19: 10 mg via ORAL
  Filled 2023-11-18: qty 1

## 2023-11-18 NOTE — Group Note (Signed)
 Date:  11/18/2023 Time:  9:41 AM  Group Topic/Focus:  Goals Group:   The focus of this group is to help patients establish daily goals to achieve during treatment and discuss how the patient can incorporate goal setting into their daily lives to aide in recovery.    Participation Level:  Active  Participation Quality:  Appropriate and Attentive  Affect:  Appropriate  Cognitive:  Alert and Appropriate  Insight: Appropriate and Improving  Engagement in Group:  Engaged and Improving  Modes of Intervention:  Discussion and Exploration  Additional Comments:  Pt attended and participated in goals group.  Kristi HERO Matisyn Cabeza 11/18/2023, 9:41 AM

## 2023-11-18 NOTE — Plan of Care (Signed)
   Problem: Education: Goal: Knowledge of [place] [facility] Education information/materials will improve Outcome: Progressing Goal: Emotional status will improve Outcome: Progressing Goal: Mental status will improve Outcome: Progressing

## 2023-11-18 NOTE — Progress Notes (Signed)
 [facility] MD Progress Note  11/18/2023 2:03 PM Mark [name]  MRN:  [id number] Principal Problem: Bipolar 1 disorder, mixed, severe (HCC) Diagnosis: Principal Problem:   Bipolar 1 disorder, mixed, severe (HCC) Active Problems:   Intellectual disability  Total Time spent with patient: 30 minutes  Mark [name] is a 33 year old male with a history of IDD, bipolar disorder, ADHD, PTSD, suicidal ideation. Presented to [facility] via [facility] for worsening suicidal ideation.    Case was discussed in the multidisciplinary team. MAR was reviewed and patient is compliant with medications.  Got hydroxyzine  PRN at bedtime.   Psychiatric Team made the following recommendations yesterday: Increased Latuda  from 40 to 60 mg  Information from patient: On interview today provider made a mistake and discussed discharge with patient, as confused with rooommate.  Patient became very upset and was inconsolable for some time.  After clarifying the mistake and apologizing patient was redirectable and able to engage in a interview.  Patient apologized for his behavior earlier in the morning.  He reports that he is tolerating the Latuda  increase without side effects.  Pt denies extrapyramidal symptoms including dystonia (sudden spastic contractions of muscle groups), parkinsonism (bradykinesia, tremors, rigidity), and akathisia (severe restlessness).  Reports that he was unaware of the Prozac  being discontinued but he is okay with it.  Reports he is sleeping okay and eating okay.  Past Psychiatric History:  Previous psychiatric diagnoses: IDD, MDD, bipolar disorder, impulse control disorder, PTSD, and ADHD Prior psychiatric treatment: Trial of trazodone , Depakote , gabapentin , hydroxyzine , Topamax  Psychiatric medication compliance history: Noncompliant  Current psychiatric treatment: Depakote  ER 500 mg, Latuda  20 mg, trazodone  100 mg Current psychiatrist: Prentice Espy, MD Current therapist: N/A  Previous  hospitalizations [facility] 8/25 for SI.  History of suicide attempts: 7 previous attempts in adolescence   Past Medical History:  Past Medical History:  Diagnosis Date   ADHD (attention deficit hyperactivity disorder)    Bipolar 1 disorder (HCC)    Mental retardation     Past Surgical History:  Procedure Laterality Date   NO PAST SURGERIES     Family History:  Family History  Problem Relation Age of Onset   Hypertension Mother    Hyperlipidemia Mother    Family Psychiatric  History: Patient unsure Social History:  Social History   Substance and Sexual Activity  Alcohol Use No     Social History   Substance and Sexual Activity  Drug Use Yes   Types: Marijuana    Social History   Socioeconomic History   Marital status: Single    Spouse name: Not on file   Number of children: Not on file   Years of education: Not on file   Highest education level: Not on file  Occupational History   Not on file  Tobacco Use   Smoking status: Every Day    Current packs/day: 1.00    Average packs/day: 1 pack/day for 0.7 years (0.7 ttl pk-yrs)    Types: Cigarettes    Start date: 2025   Smokeless tobacco: Not on file  Substance and Sexual Activity   Alcohol use: No   Drug use: Yes    Types: Marijuana   Sexual activity: Yes  Other Topics Concern   Not on file  Social History Narrative   ** Merged History Encounter **       Social Drivers of Health   Financial Resource Strain: Low Risk  (02/20/2023)   Received from [facility]   Overall Financial Resource Strain (CARDIA)  Difficulty of Paying Living Expenses: Not hard at all  Food Insecurity: No Food Insecurity (11/13/2023)   Hunger Vital Sign    Worried About Running Out of Food in the Last Year: Never true    Ran Out of Food in the Last Year: Never true  Transportation Needs: No Transportation Needs (11/13/2023)   PRAPARE - Administrator, Civil Service (Medical): No    Lack of Transportation (Non-Medical): No   Physical Activity: Sufficiently Active (02/20/2023)   [facility]   Exercise Vital Sign    On average, how many days per week do you engage in moderate to strenuous exercise (like a brisk walk)?: 5 days    On average, how many minutes do you engage in exercise at this level?: 120 min  Stress: Stress Concern Present (02/20/2023)   Received from [facility] - Occupational Stress Questionnaire    Feeling of Stress : To some extent  Social Connections: Somewhat Isolated (02/20/2023)   Received from [facility]   Social Network    How would you rate your social network (family, work, friends)?: Restricted participation with some degree of social isolation   Additional Social History:                         Sleep: Good Estimated Sleeping Duration (Last 24 Hours): 5.75-8.25 hours  Appetite:  Good  Current Medications: Current Facility-Administered Medications  Medication Dose Route Frequency Provider Last Rate Last Admin   acetaminophen  (TYLENOL ) tablet 650 mg  650 mg Oral Q6H PRN Wise, James Benjamin Stallworth, MD       alum & mag hydroxide-simeth (MAALOX/MYLANTA) 200-200-20 MG/5ML suspension 30 mL  30 mL Oral Q4H PRN Delsie Lynwood Morene Lavone, MD       haloperidol  (HALDOL ) tablet 5 mg  5 mg Oral TID PRN Delsie Lynwood Morene Lavone, MD       And   diphenhydrAMINE  (BENADRYL ) capsule 50 mg  50 mg Oral TID PRN Delsie Lynwood Morene Lavone, MD       haloperidol  lactate (HALDOL ) injection 5 mg  5 mg Intramuscular TID PRN Delsie Lynwood Morene Lavone, MD       And   diphenhydrAMINE  (BENADRYL ) injection 50 mg  50 mg Intramuscular TID PRN Delsie Lynwood Morene Lavone, MD       And   LORazepam  (ATIVAN ) injection 2 mg  2 mg Intramuscular TID PRN Delsie Lynwood Morene Lavone, MD       haloperidol  lactate (HALDOL ) injection 10 mg  10 mg Intramuscular TID PRN Delsie Lynwood Morene Lavone, MD        And   diphenhydrAMINE  (BENADRYL ) injection 50 mg  50 mg Intramuscular TID PRN Delsie Lynwood Morene Lavone, MD       And   LORazepam  (ATIVAN ) injection 2 mg  2 mg Intramuscular TID PRN Delsie Lynwood Morene Lavone, MD       divalproex  (DEPAKOTE  ER) 24 hr tablet 2,000 mg  2,000 mg Oral QHS Delsie Lynwood Morene Lavone, MD   2,000 mg at 11/17/23 2120   hydrOXYzine  (ATARAX ) tablet 10 mg  10 mg Oral TID PRN Delsie Lynwood Morene Lavone, MD   10 mg at 11/17/23 2121   [START ON 11/19/2023] hydrOXYzine  (ATARAX ) tablet 10 mg  10 mg Oral q AM Towana Leita SAILOR, MD       lurasidone  (LATUDA ) tablet 60 mg  60 mg Oral Q supper Elodie Palma,  MD   60 mg at 11/17/23 1644   magnesium  hydroxide (MILK OF MAGNESIA) suspension 30 mL  30 mL Oral Daily PRN Delsie Lynwood Morene Lavone, MD       nicotine  polacrilex (NICORETTE ) gum 2 mg  2 mg Oral PRN Delsie Lynwood Morene Lavone, MD   2 mg at 11/18/23 1359   traZODone  (DESYREL ) tablet 200 mg  200 mg Oral QHS Delsie Lynwood Morene Lavone, MD   200 mg at 11/17/23 2121    Lab Results: No results found for this or any previous visit (from the past 48 hours).  Blood Alcohol level:  Lab Results  Component Value Date   [facility] <15 11/12/2023   ETH <15 11/02/2023    Metabolic Disorder Labs: Lab Results  Component Value Date   HGBA1C 5.2 11/05/2023   MPG 102.54 11/05/2023   MPG 154 09/04/2014   No results found for: PROLACTIN Lab Results  Component Value Date   CHOL 185 11/06/2023   TRIG 148 11/06/2023   HDL 40 (L) 11/06/2023   CHOLHDL 4.6 11/06/2023   VLDL 30 11/06/2023   LDLCALC 115 (H) 11/06/2023   LDLCALC 77 11/05/2023    Physical Findings: AIMS:  ,  ,  ,  ,  ,  ,   CIWA:    COWS:     Musculoskeletal: Strength & Muscle Tone: within normal limits Gait & Station: normal Patient leans: N/A  Psychiatric Specialty Exam:  Presentation  General Appearance:  Casual  Eye Contact: Good  Speech: Clear and Coherent  Speech  Volume: Normal  Handedness: Right   Mood and Affect  Mood: Euphoric  Affect: Congruent   Thought Process  Thought Processes: Coherent  Descriptions of Associations:Intact  Orientation:Full (Time, Place and Person)  Thought Content:Logical  History of Schizophrenia/Schizoaffective disorder:No  Duration of Psychotic Symptoms: n/a Hallucinations:Hallucinations: None  Ideas of Reference:None  Suicidal Thoughts:Suicidal Thoughts: No  Homicidal Thoughts:Homicidal Thoughts: No   Sensorium  Memory: Immediate Good; Recent Good  Judgment: Fair  Insight: Fair   Art therapist  Concentration: Good  Attention Span: Good  Recall: Fair  Fund of Knowledge: Fair  Language: Good   Psychomotor Activity  Psychomotor Activity:Psychomotor Activity: Normal   Assets  Assets: Financial Resources/Insurance; Housing   Sleep  Sleep:Sleep: Good    Physical Exam: Physical Exam Constitutional:      General: He is not in acute distress.    Appearance: He is normal weight. He is not ill-appearing.  HENT:     Head: Normocephalic and atraumatic.  Neurological:     General: No focal deficit present.     Mental Status: He is alert.    Review of Systems  Gastrointestinal:  Negative for abdominal pain, constipation, diarrhea, nausea and vomiting.  Neurological:  Negative for dizziness and headaches.  Psychiatric/Behavioral:  Negative for hallucinations and suicidal ideas.    Blood pressure 117/63, pulse 72, temperature 98 F (36.7 C), temperature source Oral, resp. rate 18, height 6' 2 (1.88 m), weight 86.2 kg, SpO2 98%. Body mass index is 24.39 kg/m.   Assessment/Plan:  Mark [name] is a 33 year old male with a history of IDD, bipolar disorder, ADHD, PTSD, suicidal ideation. Presented to [facility] via GPD for worsening suicidal ideation.  He has a legal guardian.   Will plan for possible discharge on Monday.  Tolerating increase in Latuda   without side effects.  # Bipolar 1 disorder, mixed, severe # IDD - Continue Latuda  60 mg with dinner for bipolar disorder -Continue home Depakote  2  g nightly for bipolar disorder - Continue home trazodone  200 mg nightly for insomnia - Atarax  25 mg TID as needed for anxiety - Agitation Protocol: Haldol , Benadryl , Ativan   #Dog Bite  - Continue Augmentin  through 9/6 as prescribed by ED  Other as needed medications  Tylenol  650 mg every 6 hours as needed for pain Mylanta 30 mL every 4 hours as needed for indigestion Milk of magnesia 30 mL daily as needed for constipation   The risks/benefits/side-effects/alternatives to the above medication were discussed in detail with the patient and time was given for questions. The patient consents to medication trial. FDA black box warnings, if present, were discussed.  The patient is agreeable with the medication plan, as above. We will monitor the patient's response to pharmacologic treatment, and adjust medications as necessary.   I certify that inpatient services furnished can reasonably be expected to improve the patient's condition.     Mark Cornish, MD 11/18/2023, 2:03 PM

## 2023-11-18 NOTE — Group Note (Signed)
 [facility] LCSW Group Therapy Note  Date/Time:  @TD @ 10:00-11:00  Type of Therapy and Topic:  Group Therapy:  Personal Bill of Rights  Participation Level:  Active   Description of Group This process group involved a discussion with and between patients about Understanding self.   The difference between healthy and unhealthy coping skills was described, then examples were elicited from group members for their Personal bill of Rights.  This then was followed by a discussion about boundaries, respects and what the patient wants, what it is, how important it is, and why we choose the coping techniques we choose.  Participants were encouraged to think about knowing what they want as necessary and positive.  Therapeutic Goals Patient will identify and describe what their boundary consists of Patient will participate in generating ideas when they use their coping skills to address their boundary  Patients will be supportive of one another and receive support from others Patients will understand the need all humans have to   Summary of Patient Progress:  The patient expressed engagement and being atune to group   Therapeutic Modalities Brief Solution-Focused Therapy Psychoeducation  Darcel Zick O Demerius Podolak, LCSWA 11/18/2023  1:40 PM

## 2023-11-18 NOTE — Group Note (Signed)
 Date:  11/18/2023 Time:  6:02 PM  Group Topic/Focus:  The focus of this group is to introduce the topic of wellness and how collage can be used as a creative outlet for expressing emotions, reducing anxiety, while fostering group cohesion and enabling personal insight and healing.     Participation Level:  Active  Participation Quality:  Appropriate and Attentive  Affect:  Appropriate  Cognitive:  Alert and Appropriate  Insight: Appropriate and Improving  Engagement in Group:  Engaged and Supportive  Modes of Intervention:  Activity, Discussion, Exploration, Rapport Building, Socialization, and Support  Additional Comments:    Berwyn GORMAN Acosta 11/18/2023, 6:02 PM

## 2023-11-18 NOTE — Progress Notes (Signed)
(  Sleep Hours) -10.0 (Any PRNs that were needed, meds refused, or side effects to meds)- hydroxyzine  @ 2121 (Any disturbances and when (visitation, over night)-none (Concerns raised by the patient)- none (SI/HI/AVH)- Denies all

## 2023-11-18 NOTE — Plan of Care (Signed)
  Problem: Activity: Goal: Interest or engagement in activities will improve Outcome: Progressing   Problem: Coping: Goal: Ability to verbalize frustrations and anger appropriately will improve Outcome: Progressing   Problem: Physical Regulation: Goal: Ability to maintain clinical measurements within normal limits will improve Outcome: Progressing

## 2023-11-18 NOTE — Group Note (Signed)
 Date:  11/18/2023 Time:  9:58 AM  Group Topic/Focus:  Coping Mechanisms This group focused on coping mechanisms and understanding the importance of developing healthier ways to manage stress, emotional distress, and mental health challenges. The group explored healthy vs. nonhealthy coping mechanisms, emphasizing how some behaviors like substance abuse or avoidance can worsen mental health, while others like adequate sleep and food and relaxation techniques can lead to better emotional regulation and overall well-being.  A worksheet was provided to participants and highlighted adaptive vs. maladaptive coping mechanisms. Participants were encouraged to share their personal experiences and engage in conversation focused on resilience, improving self-awareness, and practicing deep breathing as a positive coping mechanism.  Participation Level:  Active  Participation Quality:  Appropriate and Attentive  Affect:  Appropriate  Cognitive:  Alert and Appropriate  Insight: Appropriate and Improving  Engagement in Group:  Engaged and Improving  Modes of Intervention:  Discussion, Education, and Problem-solving  Additional Comments:  Pt attended and participated in this group.  Kristi HERO Mckenzie Bove 11/18/2023, 9:58 AM

## 2023-11-19 ENCOUNTER — Encounter (HOSPITAL_COMMUNITY): Payer: Self-pay

## 2023-11-19 MED ORDER — HYDROXYZINE HCL 25 MG PO TABS
25.0000 mg | ORAL_TABLET | Freq: Every morning | ORAL | Status: DC
  Filled 2023-11-19: qty 1

## 2023-11-19 NOTE — Plan of Care (Signed)
  Problem: Education: Goal: Mental status will improve Outcome: Progressing   Problem: Activity: Goal: Interest or engagement in activities will improve Outcome: Progressing   Problem: Coping: Goal: Ability to verbalize frustrations and anger appropriately will improve Outcome: Progressing

## 2023-11-19 NOTE — Progress Notes (Signed)
 [facility] MD Progress Note  11/19/2023 5:11 PM Mark [name]  MRN:  [id number] Principal Problem: Bipolar 1 disorder, mixed, severe (HCC) Diagnosis: Principal Problem:   Bipolar 1 disorder, mixed, severe (HCC) Active Problems:   Intellectual disability  Total Time spent with patient: 30 minutes  Mark [name] is a 33 year old male with a history of IDD, bipolar disorder, ADHD, PTSD, suicidal ideation. Presented to [facility] via [facility] for worsening suicidal ideation.    Case was discussed in the multidisciplinary team. MAR was reviewed and patient is compliant with medications.  Got hydroxyzine  PRN at bedtime.   Information from patient:  On interview today, patient reports doing well.  Denies SI, HI, auditory visual hallucinations.  Is however endorsing some anxiety related to discharge and requesting an increase in anxiety medications.  We discussed how this is normal feeling near discharge.  Patient nervous of the idea of discharging tomorrow.  Reports that he would have a safe place to go as he has a boardinghouse room that he pays for monthly and he may also be able to go to the apartment where he and his fiance have been living for several months.  Past Psychiatric History:  Previous psychiatric diagnoses: IDD, MDD, bipolar disorder, impulse control disorder, PTSD, and ADHD Prior psychiatric treatment: Trial of trazodone , Depakote , gabapentin , hydroxyzine , Topamax  Psychiatric medication compliance history: Noncompliant  Current psychiatric treatment: Depakote  ER 500 mg, Latuda  20 mg, trazodone  100 mg Current psychiatrist: Prentice Espy, MD Current therapist: N/A  Previous hospitalizations [facility] 8/25 for SI.  History of suicide attempts: 7 previous attempts in adolescence   Past Medical History:  Past Medical History:  Diagnosis Date   ADHD (attention deficit hyperactivity disorder)    Bipolar 1 disorder (HCC)    Mental retardation     Past Surgical History:  Procedure  Laterality Date   NO PAST SURGERIES     Family History:  Family History  Problem Relation Age of Onset   Hypertension Mother    Hyperlipidemia Mother    Family Psychiatric  History: Patient unsure Social History:  Social History   Substance and Sexual Activity  Alcohol Use No     Social History   Substance and Sexual Activity  Drug Use Yes   Types: Marijuana    Social History   Socioeconomic History   Marital status: Single    Spouse name: Not on file   Number of children: Not on file   Years of education: Not on file   Highest education level: Not on file  Occupational History   Not on file  Tobacco Use   Smoking status: Every Day    Current packs/day: 1.00    Average packs/day: 1 pack/day for 0.7 years (0.7 ttl pk-yrs)    Types: Cigarettes    Start date: 2025   Smokeless tobacco: Not on file  Substance and Sexual Activity   Alcohol use: No   Drug use: Yes    Types: Marijuana   Sexual activity: Yes  Other Topics Concern   Not on file  Social History Narrative   ** Merged History Encounter **       Social Drivers of Health   Financial Resource Strain: Low Risk  (02/20/2023)   Received from [facility]   Overall Financial Resource Strain (CARDIA)    Difficulty of Paying Living Expenses: Not hard at all  Food Insecurity: No Food Insecurity (11/13/2023)   Hunger Vital Sign    Worried About Running Out of Food in the  Last Year: Never true    Ran Out of Food in the Last Year: Never true  Transportation Needs: No Transportation Needs (11/13/2023)   PRAPARE - Administrator, Civil Service (Medical): No    Lack of Transportation (Non-Medical): No  Physical Activity: Sufficiently Active (02/20/2023)   Received from [facility]   Exercise Vital Sign    On average, how many days per week do you engage in moderate to strenuous exercise (like a brisk walk)?: 5 days    On average, how many minutes do you engage in exercise at this level?: 120 min   Stress: Stress Concern Present (02/20/2023)   Received from [facility] - Occupational Stress Questionnaire    Feeling of Stress : To some extent  Social Connections: Somewhat Isolated (02/20/2023)   Received from [facility]   Social Network    How would you rate your social network (family, work, friends)?: Restricted participation with some degree of social isolation   Additional Social History:                         Sleep: Good Estimated Sleeping Duration (Last 24 Hours): 6.25-8.25 hours  Appetite:  Good  Current Medications: Current Facility-Administered Medications  Medication Dose Route Frequency Provider Last Rate Last Admin   acetaminophen  (TYLENOL ) tablet 650 mg  650 mg Oral Q6H PRN Wise, James Benjamin Stallworth, MD       alum & mag hydroxide-simeth (MAALOX/MYLANTA) 200-200-20 MG/5ML suspension 30 mL  30 mL Oral Q4H PRN Delsie Lynwood Morene Lavone, MD       haloperidol  (HALDOL ) tablet 5 mg  5 mg Oral TID PRN Delsie Lynwood Morene Lavone, MD       And   diphenhydrAMINE  (BENADRYL ) capsule 50 mg  50 mg Oral TID PRN Delsie Lynwood Morene Lavone, MD       haloperidol  lactate (HALDOL ) injection 5 mg  5 mg Intramuscular TID PRN Delsie Lynwood Morene Lavone, MD       And   diphenhydrAMINE  (BENADRYL ) injection 50 mg  50 mg Intramuscular TID PRN Delsie Lynwood Morene Lavone, MD       And   LORazepam  (ATIVAN ) injection 2 mg  2 mg Intramuscular TID PRN Delsie Lynwood Morene Lavone, MD       haloperidol  lactate (HALDOL ) injection 10 mg  10 mg Intramuscular TID PRN Delsie Lynwood Morene Lavone, MD       And   diphenhydrAMINE  (BENADRYL ) injection 50 mg  50 mg Intramuscular TID PRN Delsie Lynwood Morene Lavone, MD       And   LORazepam  (ATIVAN ) injection 2 mg  2 mg Intramuscular TID PRN Delsie Lynwood Morene Lavone, MD       divalproex  (DEPAKOTE  ER) 24 hr tablet 2,000 mg  2,000 mg Oral QHS Delsie Lynwood Morene Lavone, MD   2,000 mg at 11/18/23 2116   hydrOXYzine  (ATARAX ) tablet 10 mg  10 mg Oral TID PRN Delsie Lynwood Morene Lavone, MD   10 mg at 11/18/23 2116   hydrOXYzine  (ATARAX ) tablet 10 mg  10 mg Oral q AM Towana Leita SAILOR, MD   10 mg at 11/19/23 0803   lurasidone  (LATUDA ) tablet 60 mg  60 mg Oral Q supper Elodie Palma, MD   60 mg at 11/18/23 1701   magnesium  hydroxide (MILK OF MAGNESIA) suspension 30 mL  30 mL Oral Daily PRN Delsie Lynwood Morene Lavone, MD  nicotine  polacrilex (NICORETTE ) gum 2 mg  2 mg Oral PRN Delsie Lynwood Morene Lavone, MD   2 mg at 11/19/23 1303   traZODone  (DESYREL ) tablet 200 mg  200 mg Oral QHS Delsie Lynwood Morene Lavone, MD   200 mg at 11/18/23 2116    Lab Results: No results found for this or any previous visit (from the past 48 hours).  Blood Alcohol level:  Lab Results  Component Value Date   [facility] <15 11/12/2023   ETH <15 11/02/2023    Metabolic Disorder Labs: Lab Results  Component Value Date   HGBA1C 5.2 11/05/2023   MPG 102.54 11/05/2023   MPG 154 09/04/2014   No results found for: PROLACTIN Lab Results  Component Value Date   CHOL 185 11/06/2023   TRIG 148 11/06/2023   HDL 40 (L) 11/06/2023   CHOLHDL 4.6 11/06/2023   VLDL 30 11/06/2023   LDLCALC 115 (H) 11/06/2023   LDLCALC 77 11/05/2023    Physical Findings: AIMS:  ,  ,  ,  ,  ,  ,   CIWA:    COWS:     Musculoskeletal: Strength & Muscle Tone: within normal limits Gait & Station: normal Patient leans: N/A  Psychiatric Specialty Exam:  Presentation  General Appearance:  Casual  Eye Contact: Good  Speech: Clear and Coherent  Speech Volume: Normal  Handedness: Right   Mood and Affect  Mood: Euphoric  Affect: Congruent   Thought Process  Thought Processes: Coherent  Descriptions of Associations:Intact  Orientation:Full (Time, Place and Person)  Thought Content:Logical  History of Schizophrenia/Schizoaffective  disorder:No  Duration of Psychotic Symptoms: n/a Hallucinations:No data recorded  Ideas of Reference:None  Suicidal Thoughts:No data recorded  Homicidal Thoughts:No data recorded   Sensorium  Memory: Immediate Good; Recent Good  Judgment: Fair  Insight: Fair   Art therapist  Concentration: Good  Attention Span: Good  Recall: Fair  Fund of Knowledge: Fair  Language: Good   Psychomotor Activity  Psychomotor Activity:No data recorded   Assets  Assets: Financial Resources/Insurance; Housing   Sleep  Sleep:No data recorded    Physical Exam: Physical Exam Constitutional:      General: He is not in acute distress.    Appearance: He is not ill-appearing.  Pulmonary:     Effort: Pulmonary effort is normal. No respiratory distress.  Skin:    General: Skin is warm and dry.  Neurological:     Mental Status: He is alert.    Review of Systems  Constitutional: Negative.   Respiratory: Negative.    Cardiovascular: Negative.   Gastrointestinal: Negative.    Blood pressure 114/70, pulse 71, temperature 98.3 F (36.8 C), temperature source Oral, resp. rate 18, height 6' 2 (1.88 m), weight 86.2 kg, SpO2 99%. Body mass index is 24.39 kg/m.   Assessment/Plan:  Mark [name] is a 33 year old male with a history of IDD, bipolar disorder, ADHD, PTSD, suicidal ideation. Presented to [facility] via [facility] for worsening suicidal ideation.  He has a legal guardian.   Will plan for possible discharge on Monday.  Tolerating increase in Latuda  without side effects.  # Bipolar 1 disorder, mixed, severe # IDD - Continue Latuda  60 mg with dinner for bipolar disorder -Continue home Depakote  2 g nightly for bipolar disorder - Continue home trazodone  200 mg nightly for insomnia - Atarax  25 mg TID as needed for anxiety - Agitation Protocol: Haldol , Benadryl , Ativan   #Dog Bite  - Finished Augmentin  through 9/6 as prescribed by ED  Other as  needed medications   Tylenol  650 mg every 6 hours as needed for pain Mylanta 30 mL every 4 hours as needed for indigestion Milk of magnesia 30 mL daily as needed for constipation   The risks/benefits/side-effects/alternatives to the above medication were discussed in detail with the patient and time was given for questions. The patient consents to medication trial. FDA black box warnings, if present, were discussed.  The patient is agreeable with the medication plan, as above. We will monitor the patient's response to pharmacologic treatment, and adjust medications as necessary.   I certify that inpatient services furnished can reasonably be expected to improve the patient's condition.     Penne Mori, DO 11/19/2023, 5:11 PM

## 2023-11-19 NOTE — Group Note (Signed)
 Therapy Group Note  Group Topic:Other  Group Date: 11/19/2023 Start Time: 1500 End Time: 1530 Facilitators: Keyona Emrich G, OT    The primary objective of this topic is to explore and understand the concept of occupational balance in the context of daily living. The term occupational balance is defined broadly, encompassing all activities that occupy an individual's time and energy, including self-care, leisure, and work-related tasks. The goal is to guide participants towards achieving a harmonious blend of these activities, tailored to their personal values and life circumstances. This balance is aimed at enhancing overall well-being, not by equally distributing time across activities, but by ensuring that daily engagements are fulfilling and not draining. The content delves into identifying various barriers that individuals face in achieving occupational balance, such as overcommitment, misaligned priorities, external pressures, and lack of effective time management. The impact of these barriers on occupational performance, roles, and lifestyles is examined, highlighting issues like reduced efficiency, strained relationships, and potential health problems. Strategies for cultivating occupational balance are a key focus. These strategies include practical methods like time blocking, prioritizing tasks, establishing self-care rituals, decluttering, connecting with nature, and engaging in reflective practices. These approaches are designed to be adaptable and applicable to a wide range of life scenarios, promoting a proactive and mindful approach to daily living. The overall aim is to equip participants with the knowledge and tools to create a balanced lifestyle that supports their mental, emotional, and physical health, thereby improving their functional performance in daily life.     Participation Level: Engaged   Participation Quality: Independent   Behavior: Appropriate   Speech/Thought  Process: Relevant   Affect/Mood: Appropriate   Insight: Fair   Judgement: Fair      Modes of Intervention: Education  Patient Response to Interventions:  Attentive   Plan: Continue to engage patient in OT groups 2 - 3x/week.  11/19/2023  Mark [name], OT  Keegen Heffern, OT

## 2023-11-19 NOTE — BH IP Treatment Plan (Signed)
 Interdisciplinary Treatment and Diagnostic Plan Update  11/19/2023 Time of Session: 9:40AM - UPDATE Mark [name] MRN: [id number]  Principal Diagnosis: Bipolar 1 disorder, mixed, severe (HCC)  Secondary Diagnoses: Principal Problem:   Bipolar 1 disorder, mixed, severe (HCC) Active Problems:   Intellectual disability   Current Medications:  Current Facility-Administered Medications  Medication Dose Route Frequency Provider Last Rate Last Admin   acetaminophen  (TYLENOL ) tablet 650 mg  650 mg Oral Q6H PRN Wise, James Benjamin Stallworth, MD       alum & mag hydroxide-simeth (MAALOX/MYLANTA) 200-200-20 MG/5ML suspension 30 mL  30 mL Oral Q4H PRN Delsie Lynwood Morene Lavone, MD       haloperidol  (HALDOL ) tablet 5 mg  5 mg Oral TID PRN Delsie Lynwood Morene Lavone, MD       And   diphenhydrAMINE  (BENADRYL ) capsule 50 mg  50 mg Oral TID PRN Delsie Lynwood Morene Lavone, MD       haloperidol  lactate (HALDOL ) injection 5 mg  5 mg Intramuscular TID PRN Delsie Lynwood Morene Lavone, MD       And   diphenhydrAMINE  (BENADRYL ) injection 50 mg  50 mg Intramuscular TID PRN Delsie Lynwood Morene Lavone, MD       And   LORazepam  (ATIVAN ) injection 2 mg  2 mg Intramuscular TID PRN Delsie Lynwood Morene Lavone, MD       haloperidol  lactate (HALDOL ) injection 10 mg  10 mg Intramuscular TID PRN Delsie Lynwood Morene Lavone, MD       And   diphenhydrAMINE  (BENADRYL ) injection 50 mg  50 mg Intramuscular TID PRN Delsie Lynwood Morene Lavone, MD       And   LORazepam  (ATIVAN ) injection 2 mg  2 mg Intramuscular TID PRN Delsie Lynwood Morene Lavone, MD       divalproex  (DEPAKOTE  ER) 24 hr tablet 2,000 mg  2,000 mg Oral QHS Delsie Lynwood Morene Lavone, MD   2,000 mg at 11/18/23 2116   hydrOXYzine  (ATARAX ) tablet 10 mg  10 mg Oral TID PRN Delsie Lynwood Morene Lavone, MD   10 mg at 11/18/23 2116   hydrOXYzine  (ATARAX ) tablet 10 mg  10 mg Oral q AM Towana Leita SAILOR, MD   10 mg  at 11/19/23 0803   lurasidone  (LATUDA ) tablet 60 mg  60 mg Oral Q supper Elodie Palma, MD   60 mg at 11/18/23 1701   magnesium  hydroxide (MILK OF MAGNESIA) suspension 30 mL  30 mL Oral Daily PRN Delsie Lynwood Morene Lavone, MD       nicotine  polacrilex (NICORETTE ) gum 2 mg  2 mg Oral PRN Delsie Lynwood Morene Lavone, MD   2 mg at 11/19/23 0805   traZODone  (DESYREL ) tablet 200 mg  200 mg Oral QHS Delsie Lynwood Morene Lavone, MD   200 mg at 11/18/23 2116   PTA Medications: Medications Prior to Admission  Medication Sig Dispense Refill Last Dose/Taking   amoxicillin -clavulanate (AUGMENTIN ) 875-125 MG tablet Take 1 tablet by mouth 2 (two) times daily. 9 tablet 0    divalproex  (DEPAKOTE  ER) 500 MG 24 hr tablet Take 4 tablets (2,000 mg total) by mouth at bedtime. 120 tablet 0    lurasidone  (LATUDA ) 20 MG TABS tablet Take 1 tablet (20 mg total) by mouth at bedtime. 30 tablet 0    traZODone  (DESYREL ) 100 MG tablet Take 2 tablets (200 mg total) by mouth at bedtime. 14 tablet 0     Patient Stressors: Traumatic event    Patient Strengths: Motivation for treatment/growth  Treatment Modalities: Medication Management, Group therapy, Case management,  1 to 1 session with clinician, Psychoeducation, Recreational therapy.   Physician Treatment Plan for Primary Diagnosis: Bipolar 1 disorder, mixed, severe (HCC) Long Term Goal(s):     Short Term Goals:    Medication Management: Evaluate patient's response, side effects, and tolerance of medication regimen.  Therapeutic Interventions: 1 to 1 sessions, Unit Group sessions and Medication administration.  Evaluation of Outcomes: Progressing  Physician Treatment Plan for Secondary Diagnosis: Principal Problem:   Bipolar 1 disorder, mixed, severe (HCC) Active Problems:   Intellectual disability  Long Term Goal(s):     Short Term Goals:       Medication Management: Evaluate patient's response, side effects, and tolerance of medication  regimen.  Therapeutic Interventions: 1 to 1 sessions, Unit Group sessions and Medication administration.  Evaluation of Outcomes: Progressing   RN Treatment Plan for Primary Diagnosis: Bipolar 1 disorder, mixed, severe (HCC) Long Term Goal(s): Knowledge of disease and therapeutic regimen to maintain health will improve  Short Term Goals: Ability to remain free from injury will improve, Ability to verbalize frustration and anger appropriately will improve, Ability to demonstrate self-control, Ability to participate in decision making will improve, Ability to verbalize feelings will improve, Ability to disclose and discuss suicidal ideas, and Ability to identify and develop effective coping behaviors will improve  Medication Management: RN will administer medications as ordered by provider, will assess and evaluate patient's response and provide education to patient for prescribed medication. RN will report any adverse and/or side effects to prescribing provider.  Therapeutic Interventions: 1 on 1 counseling sessions, Psychoeducation, Medication administration, Evaluate responses to treatment, Monitor vital signs and CBGs as ordered, Perform/monitor CIWA, COWS, AIMS and Fall Risk screenings as ordered, Perform wound care treatments as ordered.  Evaluation of Outcomes: Progressing   LCSW Treatment Plan for Primary Diagnosis: Bipolar 1 disorder, mixed, severe (HCC) Long Term Goal(s): Safe transition to appropriate next level of care at discharge, Engage patient in therapeutic group addressing interpersonal concerns.  Short Term Goals: Engage patient in aftercare planning with referrals and resources, Increase social support, Increase ability to appropriately verbalize feelings, Increase emotional regulation, Facilitate acceptance of mental health diagnosis and concerns, and Facilitate patient progression through stages of change regarding substance use diagnoses and concerns  Therapeutic  Interventions: Assess for all discharge needs, 1 to 1 time with Social worker, Explore available resources and support systems, Assess for adequacy in community support network, Educate family and significant other(s) on suicide prevention, Complete Psychosocial Assessment, Interpersonal group therapy.  Evaluation of Outcomes: Progressing   Progress in Treatment: Attending groups: attended some groups Participating in groups: Yes. Taking medication as prescribed: Yes. Toleration medication: Yes. Family/Significant other contact made: Yes, individual(s) contacted:  Carlynn Margo, girlfriend  [phone] Patient understands diagnosis: Yes. Discussing patient identified problems/goals with staff: Yes. Medical problems stabilized or resolved: Yes. Denies suicidal/homicidal ideation: Yes. Issues/concerns per patient self-inventory: No.   New problem(s) identified:  No   New Short Term/Long Term Goal(s):      medication stabilization, elimination of SI thoughts, development of comprehensive mental wellness plan.    Patient Goals:  I want to be more stable.     Discharge Plan or Barriers:  Patient recently admitted. CSW will continue to follow and assess for appropriate referrals and possible discharge planning.      Reason for Continuation of Hospitalization: Medication stabilization Suicidal ideation   Estimated Length of Stay:  5 - 7 days  Last 3 [place] Suicide  Severity Risk Score: Flowsheet Row [facility] Most recent reading at 11/13/2023  5:00 PM ED from 11/12/2023 in [facility] Most recent reading at 11/12/2023 10:46 PM ED from 11/12/2023 in [facility] Most recent reading at 11/12/2023  4:50 PM  C-SSRS RISK CATEGORY High Risk High Risk High Risk    Last PHQ 2/9 Scores:     No data to display          Scribe for Treatment  Team: Riyansh Gerstner M Geena Weinhold, ISRA[place] 11/19/2023 11:51 AM

## 2023-11-19 NOTE — Group Note (Signed)
 Date:  11/19/2023 Time:  11:04 AM  Group Topic/Focus:  Goals Group:   The focus of this group is to help patients establish daily goals to achieve during treatment and discuss how the patient can incorporate goal setting into their daily lives to aide in recovery.    Participation Level:  Active  Participation Quality:  Attentive  Affect:  Appropriate  Cognitive:  Alert  Insight: Good  Engagement in Group:  Engaged  Modes of Intervention:  Education  Additional Comments:    Mark [name] 11/19/2023, 11:04 AM

## 2023-11-19 NOTE — Progress Notes (Signed)
(  Sleep Hours) -8.5 (Any PRNs that were needed, meds refused, or side effects to meds)- prn hydroxyzine  @ 2116 (Any disturbances and when (visitation, over night)-none (Concerns raised by the patient)- none (SI/HI/AVH)- Denies all  Patient has seemed increasingly tired the last two evenings, did not attend wrap-up group. I ask patient what had changed. He states they increased his latuda  and he has been feeling very relaxed the last two evenings

## 2023-11-19 NOTE — Group Note (Signed)
 Recreation Therapy Group Note   Group Topic:Stress Management  Group Date: 11/19/2023 Start Time: 0930 End Time: 1000 Facilitators: Claudius Mich-McCall, LRT,CTRS Location: [address] Dayroom   Group Topic: Stress Management   Goal Area(s) Addresses:  Patient will actively participate in stress management techniques presented during session.  Patient will successfully identify benefit of practicing stress management post d/c.   Behavioral Response:   Intervention: Relaxation exercise with ambient sound and script   Activity: Guided Imagery. LRT provided education, instruction, and demonstration on practice of visualization via guided imagery. Patient was asked to participate in the technique introduced during session. LRT debriefed including topics of mindfulness, stress management and specific scenarios each patient could use these techniques. Patients were given suggestions of ways to access scripts post d/c and encouraged to explore Youtube and other apps available on smartphones, tablets, and computers.  Education:  Stress Management, Discharge Planning.   Education Outcome: Acknowledges education   Clinical Observations/Individualized Feedback: Group did not take place due to previous group taking recreation therapy group time.    Plan: Continue to engage patient in RT group sessions 2-3x/week.   Devera Englander-McCall, LRT,CTRS 11/19/2023 3:28 PM

## 2023-11-19 NOTE — Progress Notes (Signed)
   11/19/23 0800  Psych Admission Type (Psych Patients Only)  Admission Status Voluntary  Psychosocial Assessment  Patient Complaints None  Eye Contact Fair  Facial Expression Animated  Affect Appropriate to circumstance  Speech Logical/coherent  Interaction Assertive  Motor Activity Slow  Appearance/Hygiene Unremarkable  Behavior Characteristics Cooperative;Appropriate to situation  Mood Pleasant  Thought Process  Coherency WDL  Content WDL  Delusions None reported or observed  Perception WDL  Hallucination None reported or observed  Judgment WDL  Confusion None  Danger to Self  Current suicidal ideation? Denies  Agreement Not to Harm Self Yes  Description of Agreement Verbal  Danger to Others  Danger to Others None reported or observed

## 2023-11-20 DIAGNOSIS — F79 Unspecified intellectual disabilities: Secondary | ICD-10-CM

## 2023-11-20 MED ORDER — HYDROXYZINE HCL 10 MG PO TABS: 10.0000 mg | ORAL_TABLET | Freq: Three times a day (TID) | ORAL | 0 refills | Status: DC | PRN

## 2023-11-20 MED ORDER — LURASIDONE HCL 20 MG PO TABS: 60.0000 mg | ORAL_TABLET | Freq: Every day | ORAL | 0 refills | Status: DC

## 2023-11-20 MED ORDER — HYDROXYZINE HCL 25 MG PO TABS: 25.0000 mg | ORAL_TABLET | Freq: Every morning | ORAL | 0 refills | Status: DC

## 2023-11-20 NOTE — B[facility] Group Notes (Signed)
 [facility] Group Notes:  (Nursing/MHT/Case Management/Adjunct)  Date:  11/20/2023  Time:  5:12 AM  Type of Therapy:  Wrap up  Participation Level:  Active  Participation Quality:  Appropriate  Affect:  Appropriate  Cognitive:  Appropriate  Insight:  Appropriate  Engagement in Group:  Engaged  Modes of Intervention:  Education  Summary of Progress/Problems: Goal to stay calm, Rated day 10/10.  Mark [name] 11/20/2023, 5:12 AM

## 2023-11-20 NOTE — Progress Notes (Signed)
 Discharge Note:  Patient denies SI/HI/AVH at this time. Discharge instructions, AVS, prescriptions, and transition record gone over with patient. Patient agrees to adhere with medication management, follow-up visit, and outpatient therapy. Patient belongings returned to patient. Patient questions and concerns addressed and answered. Patient ambulatory off unit. Patient discharged to home via [facility].

## 2023-11-20 NOTE — BH Assessment (Signed)
(  Sleep Hours) - 6.75 (Any PRNs that were needed, meds refused, or side effects to meds)- Hydroxyzine  10 mg and Nicotine  gum 2 mg PO (Any disturbances and when (visitation, over night)- None (Concerns raised by the patient)- Pt is ready to be D/C (SI/HI/AVH)- Denies

## 2023-11-20 NOTE — Group Note (Signed)
 Date:  11/20/2023 Time:  11:29 AM  Group Topic/Focus:  Goals Group:   The focus of this group is to help patients establish daily goals to achieve during treatment and discuss how the patient can incorporate goal setting into their daily lives to aide in recovery.    Participation Level:  Active  Participation Quality:  Attentive  Affect:  Appropriate  Cognitive:  Alert  Insight: Good  Engagement in Group:  Engaged  Modes of Intervention:  Clarification and Education  Additional Comments:    Mark [name] 11/20/2023, 11:29 AM

## 2023-11-20 NOTE — Progress Notes (Addendum)
  [facility] Adult Case Management Discharge Plan :  Will you be returning to the same living situation after discharge:  Yes,  pt returning to boarding house at discharge ([address] in [place]) At discharge, do you have transportation home?: Yes,  CSW arranged [employer] taxi for 1130AM Do you have the ability to pay for your medications: Yes,  pt has active health insurance coverage  Release of information consent forms completed and in the chart;  Patient's signature needed at discharge.  Patient to Follow up at:  Follow-up Information     [facility]. Go on 11/28/2023.   Specialty: Behavioral Health Why: You have an appointment for medication management services on 11/28/23 at 10:30 am, in person.  At this time you may schedule an appointment for therapy services. Contact information: [address] [place]  [address] [phone]        [facility] Schedule an appointment as soon as possible for a visit.   Why: Please call this provider to set up a peer support appointment. Peer support will help you get set up with a new therapist Contact information: [address] [place] [address] [phone]                 Next level of care provider has access to [facility] Link:no  Safety Planning and Suicide Prevention discussed: [place],  Carlynn Margo, girlfriend  [phone] Called and LVM for [facility] (Legal Guardian) [phone]  regarding pt discharging today.     Has patient been referred to the Quitline?: Patient refused referral for treatment  Patient has been referred for addiction treatment: No known substance use disorder.  Jenkins LULLA Primer, LCSWA 11/20/2023, 9:31 AM

## 2023-11-20 NOTE — Progress Notes (Signed)
 Mark [name]   Spoke with [facility] (Legal Guardian) [phone], she is aware pt is discharging back to [facility] address. Aware and agreeable to pt taking taxi home at 1130AM.   Signed:  Shakala Marlatt, LCSW-A 11/20/2023  11:06 AM

## 2023-11-20 NOTE — Discharge Summary (Signed)
 Physician Discharge Summary Note  Patient:  Mark [name] is an 33 y.o., adult MRN:  [id number] DOB:  08-Mar-1991 Patient phone:  [phone] (home)  Patient address:   [address] [place] [address],  Total Time spent with patient: 20 minutes  Date of Admission:  11/13/2023 Date of Discharge: 11/20/2023  Reason for Admission:  Suicidal Ideations  Principal Problem: Bipolar 1 disorder, mixed, severe (HCC) Discharge Diagnoses: Principal Problem:   Bipolar 1 disorder, mixed, severe (HCC) Active Problems:   Intellectual disability     Past Psychiatric History:  Patient has diagnoses of ADD, bipolar disorder, ADHD, PTSD and is seeing Dr. Prentice Espy as his outpatient psychiatrist.   Past Medical History:  Past Medical History:  Diagnosis Date   ADHD (attention deficit hyperactivity disorder)    Bipolar 1 disorder (HCC)    Mental retardation     Past Surgical History:  Procedure Laterality Date   NO PAST SURGERIES     Family History:  Family History  Problem Relation Age of Onset   Hypertension Mother    Hyperlipidemia Mother    Family Psychiatric  History:  family history includes Hyperlipidemia in his mother; Hypertension in his mother.  Family psychiatric history per patient includes depression and anxiety in his mother.  Bipolar disorder in his father.  Alcoholism on both sides of the family.  Social History:  Social History   Substance and Sexual Activity  Alcohol Use No     Social History   Substance and Sexual Activity  Drug Use Yes   Types: Marijuana    Social History   Socioeconomic History   Marital status: Single    Spouse name: Not on file   Number of children: Not on file   Years of education: Not on file   Highest education level: Not on file  Occupational History   Not on file  Tobacco Use   Smoking status: Every Day    Current packs/day: 1.00    Average packs/day: 1 pack/day for 0.7 years (0.7 ttl pk-yrs)    Types: Cigarettes     Start date: 2025   Smokeless tobacco: Not on file  Substance and Sexual Activity   Alcohol use: No   Drug use: Yes    Types: Marijuana   Sexual activity: Yes  Other Topics Concern   Not on file  Social History Narrative   ** Merged History Encounter **       Social Drivers of Health   Financial Resource Strain: Low Risk  (02/20/2023)   Received from [facility]   Overall Financial Resource Strain (CARDIA)    Difficulty of Paying Living Expenses: Not hard at all  Food Insecurity: No Food Insecurity (11/13/2023)   Hunger Vital Sign    Worried About Running Out of Food in the Last Year: Never true    Ran Out of Food in the Last Year: Never true  Transportation Needs: No Transportation Needs (11/13/2023)   PRAPARE - Administrator, Civil Service (Medical): No    Lack of Transportation (Non-Medical): No  Physical Activity: Sufficiently Active (02/20/2023)   [facility] from [facility]   Exercise Vital Sign    On average, how many days per week do you engage in moderate to strenuous exercise (like a brisk walk)?: 5 days    On average, how many minutes do you engage in exercise at this level?: 120 min  Stress: Stress Concern Present (02/20/2023)   Received from [facility]  [employer] of Occupational Health - Occupational Stress Questionnaire    Feeling of Stress : To some extent  Social Connections: Somewhat Isolated (02/20/2023)   Received from [facility]   Social Network    How would you rate your social network (family, work, friends)?: Restricted participation with some degree of social isolation    Hospital Course:   During the patient's hospitalization, patient had extensive initial psychiatric evaluation, and follow-up psychiatric evaluations every day.  Psychiatric diagnoses provided upon initial assessment: Bipolar 1 disorder, mixed, severe  Patient's psychiatric medications were adjusted on admission: Home meds continued:  - trazodone  200 mg  nightly, Depakote  2 g nightly, lurasidone  20 mg with dinner, home Prozac  20 mg daily  During the hospitalization, other adjustments were made to the patient's psychiatric medication regimen:   -Latuda  increased to 60 mg every night with dinner, Prozac  discontinued, hydroxyzine  25 mg every morning for anxiety, hydroxyzine  10 mg as needed up to 3 times daily.  Patient's care was discussed during the interdisciplinary team meeting every day during the hospitalization.  The patient denies having side effects to prescribed psychiatric medication.  Gradually, patient started adjusting to milieu. The patient was evaluated each day by a clinical provider to ascertain response to treatment. Improvement was noted by the patient's report of decreasing symptoms, improved sleep and appetite, affect, medication tolerance, behavior, and participation in unit programming.  Patient was asked each day to complete a self inventory noting mood, mental status, pain, new symptoms, anxiety and concerns.   Symptoms were reported as significantly decreased or resolved completely by discharge.  The patient reports that their mood is stable.  The patient denied having suicidal thoughts for more than 48 hours prior to discharge.  Patient denies having homicidal thoughts.  Patient denies having auditory hallucinations.  Patient denies any visual hallucinations or other symptoms of psychosis.  The patient was motivated to continue taking medication with a goal of continued improvement in mental health.   The patient reports their target psychiatric symptoms of suicidal ideations responded well to the psychiatric medications, and the patient reports overall benefit other psychiatric hospitalization. Supportive psychotherapy was provided to the patient. The patient also participated in regular group therapy while hospitalized. Coping skills, problem solving as well as relaxation therapies were also part of the unit  programming.  Labs were reviewed with the patient, and abnormal results were discussed with the patient.  The patient is able to verbalize their individual safety plan to this provider.  # It is recommended to the patient to continue psychiatric medications as prescribed, after discharge from the hospital.    # It is recommended to the patient to follow up with your outpatient psychiatric provider and PCP.  # It was discussed with the patient, the impact of alcohol, drugs, tobacco have been there overall psychiatric and medical wellbeing, and total abstinence from substance use was recommended the patient.ed.  # Prescriptions provided or sent directly to preferred pharmacy at discharge. Patient agreeable to plan. Given opportunity to ask questions. Appears to feel comfortable with discharge.    # In the event of worsening symptoms, the patient is instructed to call the crisis hotline, 911 and or go to the nearest ED for appropriate evaluation and treatment of symptoms. To follow-up with primary care provider for other medical issues, concerns and or health care needs  # Patient was discharged to his room at the boarding house with a plan to follow up as noted below.   On day  of discharge patient reports feeling less anxious, denies SI, HI, A & VH.  Reports sleeping well and is confident that he will be safe when he discharges home.  Denies any physical symptoms or complaints from the medications or otherwise.  Physical Findings: AIMS:  , ,  ,  ,  ,  ,   CIWA:    COWS:     Musculoskeletal: Strength & Muscle Tone: within normal limits Gait & Station: normal Patient leans: N/A   Psychiatric Specialty Exam:  Presentation  General Appearance:  Appropriate for Environment  Eye Contact: Good  Speech: Clear and Coherent; Normal Rate  Speech Volume: Normal  Handedness: Right   Mood and Affect  Mood: Euthymic  Affect: Congruent; Appropriate   Thought Process  Thought  Processes: Coherent  Descriptions of Associations:Intact  Orientation:Full (Time, Place and Person)  Thought Content:Logical  History of Schizophrenia/Schizoaffective disorder:No  Duration of Psychotic Symptoms:No data recorded Hallucinations:Hallucinations: None  Ideas of Reference:None  Suicidal Thoughts:Suicidal Thoughts: No  Homicidal Thoughts:Homicidal Thoughts: No   Sensorium  Memory: Immediate Good; Recent Good  Judgment: Fair  Insight: Fair   Art therapist  Concentration: Fair  Attention Span: Good  Recall: Fair  Fund of Knowledge: Fair  Language: Good   Psychomotor Activity  Psychomotor Activity: Psychomotor Activity: Normal   Assets  Assets: Communication Skills; Desire for Improvement; Financial Resources/Insurance; Housing; Physical Health; Social Support   Sleep  Sleep: Sleep: Good  Estimated Sleeping Duration (Last 24 Hours): 5.75-6.75 hours   Physical Exam: Physical Exam Vitals reviewed.  Constitutional:      General: He is not in acute distress.    Appearance: He is not toxic-appearing.  Pulmonary:     Effort: Pulmonary effort is normal. No respiratory distress.  Skin:    General: Skin is warm and dry.  Neurological:     Mental Status: He is alert.     Gait: Gait normal.    Review of Systems  Constitutional: Negative.  Negative for fever.  Respiratory: Negative.  Negative for shortness of breath.   Cardiovascular: Negative.   Gastrointestinal: Negative.    Blood pressure 122/72, pulse 81, temperature 98 F (36.7 C), temperature source Oral, resp. rate 20, height 6' 2 (1.88 m), weight 86.2 kg, SpO2 100%. Body mass index is 24.39 kg/m.   Social History   Tobacco Use  Smoking Status Every Day   Current packs/day: 1.00   Average packs/day: 1 pack/day for 0.7 years (0.7 ttl pk-yrs)   Types: Cigarettes   Start date: 2025  Smokeless Tobacco Not on file    Blood Alcohol level:  Lab Results   Component Value Date   [facility] <15 11/12/2023   ETH <15 11/02/2023    Metabolic Disorder Labs:  Lab Results  Component Value Date   HGBA1C 5.2 11/05/2023   MPG 102.54 11/05/2023   MPG 154 09/04/2014   No results found for: PROLACTIN Lab Results  Component Value Date   CHOL 185 11/06/2023   TRIG 148 11/06/2023   HDL 40 (L) 11/06/2023   CHOLHDL 4.6 11/06/2023   VLDL 30 11/06/2023   LDLCALC 115 (H) 11/06/2023   LDLCALC 77 11/05/2023    See Psychiatric Specialty Exam and Suicide Risk Assessment completed by Attending Physician prior to discharge.  Discharge destination:  Home  Is patient on multiple antipsychotic therapies at discharge:  No   Has Patient had three or more failed trials of antipsychotic monotherapy by history:  No  Recommended Plan for Multiple Antipsychotic Therapies: NA  Allergies as of 11/20/2023   No Known Allergies      Medication List     STOP taking these medications    amoxicillin -clavulanate 875-125 MG tablet Commonly known as: AUGMENTIN        TAKE these medications      Indication  divalproex  500 MG 24 hr tablet Commonly known as: DEPAKOTE  ER Take 4 tablets (2,000 mg total) by mouth at bedtime.  Indication: Depressive Phase of Manic-Depression   hydrOXYzine  10 MG tablet Commonly known as: ATARAX  Take 1 tablet (10 mg total) by mouth 3 (three) times daily as needed for anxiety.  Indication: Feeling Anxious   hydrOXYzine  25 MG tablet Commonly known as: ATARAX  Take 1 tablet (25 mg total) by mouth in the morning. Start taking on: November 21, 2023  Indication: Feeling Anxious   lurasidone  20 MG Tabs tablet Commonly known as: LATUDA  Take 3 tablets (60 mg total) by mouth daily with supper. What changed:  how much to take when to take this  Indication: Depression with Symptoms of Abnormally Elevated Mood   traZODone  100 MG tablet Commonly known as: DESYREL  Take 2 tablets (200 mg total) by mouth at bedtime.  Indication:  Trouble Sleeping        Follow-up Information     [facility]. Go on 11/28/2023.   Specialty: Behavioral Health Why: You have an appointment for medication management services on 11/28/23 at 10:30 am, in person.  At this time you may schedule an appointment for therapy services. Contact information: [address] [place]  [address] [phone]        [facility] Schedule an appointment as soon as possible for a visit.   Why: Please call this provider to set up a peer support appointment. Peer support will help you get set up with a new therapist Contact information: [address] [place] [address] [phone]                 Follow-up recommendations:    Activity: as tolerated  Diet: heart healthy  Other: -Follow-up with your outpatient psychiatric provider -instructions on appointment date, time, and address (location) are provided to you in discharge paperwork.  -Take your psychiatric medications as prescribed at discharge - instructions are provided to you in the discharge paperwork  -Follow-up with outpatient primary care doctor and other specialists -for management of chronic medical disease  -Recommend abstinence from alcohol, tobacco, and other illicit drug use at discharge.   -If your psychiatric symptoms recur, worsen, or if you have side effects to your psychiatric medications, call your outpatient psychiatric provider, 911, [phone] or go to the nearest emergency department.  -If suicidal thoughts recur, call your outpatient psychiatric provider, 911, [phone] or go to the nearest emergency department.   SignedBETHA Penne Mori, DO 11/20/2023, 2:19 PM

## 2023-11-20 NOTE — Plan of Care (Signed)
   Problem: Education: Goal: Knowledge of [facility] Education information/materials will improve Outcome: Progressing Goal: Emotional status will improve Outcome: Progressing Goal: Mental status will improve Outcome: Progressing Goal: Verbalization of understanding the information provided will improve Outcome: Progressing

## 2023-11-20 NOTE — Progress Notes (Signed)
   11/20/23 0900  Psych Admission Type (Psych Patients Only)  Admission Status Voluntary  Psychosocial Assessment  Patient Complaints None  Eye Contact Fair  Facial Expression Animated  Affect Appropriate to circumstance  Speech Logical/coherent  Interaction Assertive  Motor Activity Other (Comment) (WNL)  Appearance/Hygiene Unremarkable  Behavior Characteristics Cooperative;Appropriate to situation  Mood Pleasant  Thought Process  Coherency WDL  Content WDL  Delusions None reported or observed  Perception WDL  Hallucination None reported or observed  Judgment WDL  Confusion None  Danger to Self  Current suicidal ideation? Denies  Agreement Not to Harm Self Yes  Description of Agreement verbal  Danger to Others  Danger to Others None reported or observed

## 2023-11-20 NOTE — Transportation (Signed)
 11/20/2023  Mark [name] DOB: 1990-08-07 MRN: [id number]   RIDER WAIVER AND RELEASE OF LIABILITY  For the purposes of helping with transportation needs, [facility] partners with outside transportation providers (taxi companies, [place], Catering manager.) to give [facility] patients or other approved people the choice of on-demand rides Public librarian) to our buildings for non-emergency visits.  By using [employer], I, the person signing this document, on behalf of myself and/or any legal minors (in my care using the [employer]), agree:  Science writer given to me are supplied by independent, outside transportation providers who do not work for, or have any affiliation with, [employer]. [employer] is not a transportation company. [facility] has no control over the quality or safety of the rides I get using [employer]. [facility] has no control over whether any outside ride will happen on time or not. [facility] gives no guarantee on the reliability, quality, safety, or availability on any rides, or that no mistakes will happen. I know and accept that traveling by vehicle (car, truck, SVU, fleeta, bus, taxi, etc.) has risks of serious injuries such as disability, being paralyzed, and death. I know and agree the risk of using [employer] is mine alone, and not [employer]. [employer] are provided as is and as are available. The transportation providers are in charge for all inspections and care of the vehicles used to provide these rides. I agree not to take legal action against [facility], its agents, employees, officers, directors, representatives, insurers, attorneys, assigns, successors, subsidiaries, and affiliates at any time for any reasons related directly or indirectly to using [employer]. I also agree not to take legal action against [facility] or its affiliates for any injury, death, or damage to property caused by or related to using  [employer]. I have read this Waiver and Release of Liability, and I understand the terms used in it and their legal meaning. This Waiver is freely and voluntarily given with the understanding that my right (or any legal minors) to legal action against  relating to [employer] is knowingly given up to use these services.   I attest that I read the Ride Waiver and Release of Liability to Mark [name], gave Mr. Stmartin the opportunity to ask questions and answered the questions asked (if any). I affirm that Mark [name] then provided consent for assistance with transportation.

## 2023-11-20 NOTE — BHH Suicide Risk Assessment (Signed)
 Suicide Risk Assessment  Discharge Assessment    [facility] Discharge Suicide Risk Assessment   Principal Problem: Bipolar 1 disorder, mixed, severe (HCC) Discharge Diagnoses: Principal Problem:   Bipolar 1 disorder, mixed, severe (HCC) Active Problems:   Intellectual disability   Total Time spent with patient: 20 minutes  Musculoskeletal: Strength & Muscle Tone: within normal limits Gait & Station: normal Patient leans: N/A  Psychiatric Specialty Exam  Presentation  General Appearance:  Appropriate for Environment  Eye Contact: Good  Speech: Clear and Coherent; Normal Rate  Speech Volume: Normal  Handedness: Right   Mood and Affect  Mood: Euthymic  Duration of Depression Symptoms: Greater than two weeks  Affect: Congruent; Appropriate   Thought Process  Thought Processes: Coherent  Descriptions of Associations:Intact  Orientation:Full (Time, Place and Person)  Thought Content:Logical  History of Schizophrenia/Schizoaffective disorder:No  Duration of Psychotic Symptoms:No data recorded Hallucinations:Hallucinations: None  Ideas of Reference:None  Suicidal Thoughts:Suicidal Thoughts: No  Homicidal Thoughts:Homicidal Thoughts: No   Sensorium  Memory: Immediate Good; Recent Good  Judgment: Fair  Insight: Fair   Art therapist  Concentration: Fair  Attention Span: Good  Recall: Fair  Fund of Knowledge: Fair  Language: Good   Psychomotor Activity  Psychomotor Activity: Psychomotor Activity: Normal   Assets  Assets: Communication Skills; Desire for Improvement; Financial Resources/Insurance; Housing; Physical Health; Social Support   Sleep  Sleep: Sleep: Good  Estimated Sleeping Duration (Last 24 Hours): 5.75-6.75 hours  Physical Exam: Physical Exam Vitals reviewed.  Constitutional:      General: He is not in acute distress.    Appearance: He is not toxic-appearing.  Pulmonary:     Effort: Pulmonary  effort is normal. No respiratory distress.  Skin:    General: Skin is warm and dry.  Neurological:     Mental Status: He is alert.     Gait: Gait normal.    Review of Systems  Constitutional: Negative.  Negative for chills and fever.  Respiratory: Negative.  Negative for shortness of breath.   Cardiovascular: Negative.   Gastrointestinal: Negative.    Blood pressure 122/72, pulse 81, temperature 98 F (36.7 C), temperature source Oral, resp. rate 20, height 6' 2 (1.88 m), weight 86.2 kg, SpO2 100%. Body mass index is 24.39 kg/m.  Mental Status Per Nursing Assessment::   On Admission:  NA  Demographic Factors:  Male and Caucasian  Loss Factors: Loss of significant relationship  Historical Factors: Prior suicide attempts  Risk Reduction Factors:   Employed, Living with another person, especially a relative, Positive social support, Positive therapeutic relationship, and Positive coping skills or problem solving skills  Continued Clinical Symptoms:  More than one psychiatric diagnosis Previous Psychiatric Diagnoses and Treatments  Cognitive Features That Contribute To Risk:  Loss of executive function    Suicide Risk:  Minimal: No identifiable suicidal ideation.  Patients presenting with no risk factors but with morbid ruminations; may be classified as minimal risk based on the severity of the depressive symptoms   Follow-up Information     [facility] Go on 11/28/2023.   Specialty: Behavioral Health Why: You have an appointment for medication management services on 11/28/23 at 10:30 am, in person.  At this time you may schedule an appointment for therapy services. Contact information: [address] [place]  [address] [phone]        [facility] Schedule an appointment as soon as possible for a visit.   Why: Please call this  provider to set up a peer support appointment. Peer  support will help you get set up with a new therapist Contact information: [address] [place] [address] [phone]                 Plan Of Care/Follow-up recommendations:  Continue taking medications as prescribed and continuing with already established outpatient care. Follow up with the above appointments.  Penne Mori, DO 11/20/2023, 8:53 AM

## 2023-11-20 NOTE — Progress Notes (Signed)
   11/19/23 2040  Psych Admission Type (Psych Patients Only)  Admission Status Voluntary  Psychosocial Assessment  Patient Complaints None  Eye Contact Fair  Facial Expression Animated  Affect Appropriate to circumstance  Speech Logical/coherent  Interaction Assertive  Motor Activity Other (Comment) (WNL)  Appearance/Hygiene Unremarkable  Behavior Characteristics Cooperative;Appropriate to situation;Calm  Mood Pleasant  Thought Process  Coherency WDL  Content WDL  Delusions None reported or observed  Perception WDL  Hallucination None reported or observed  Judgment WDL  Confusion None  Danger to Self  Current suicidal ideation?  (Denies)  Agreement Not to Harm Self Yes  Description of Agreement Notify Staff  Danger to Others  Danger to Others None reported or observed

## 2023-11-23 NOTE — Progress Notes (Signed)
 Spiritual care group on grief and loss facilitated by Chaplain Rockie Sofia, [facility]  Group Goal: Support / Education around grief and loss  Members engage in facilitated group support and psycho-social education.  Group Description:  Following introductions and group rules, group members engaged in facilitated group dialogue and support around topic of loss, with particular support around experiences of loss in their lives. Group Identified types of loss (relationships / self / things) and identified patterns, circumstances, and changes that precipitate losses. Reflected on thoughts / feelings around loss, normalized grief responses, and recognized variety in grief experience. Group encouraged individual reflection on safe space and on the coping skills that they are already utilizing.  Group drew on Adlerian / Rogerian and narrative framework  Patient Progress: Mark [name] attended group and actively engaged and participated in group conversation and activities.  At times he needed to be redirected, but did so easily.

## 2023-11-23 NOTE — Progress Notes (Deleted)
 BH MD Outpatient Progress Note  11/28/2023 7:40 AM Mark [name]  MRN:  [id number]  Assessment:  Mark [name] presents for follow-up evaluation. Today, 11/28/23, patient reports ***  The risks/benefits/side-effects/alternatives to this medication were discussed in detail with the patient and time was given for questions. The patient consents to medication trial.   Identifying Information: Mark [name] is a 33 y.o. adult with a history of *** who is an established patient with [facility] for management of ***.  Risk Assessment: An assessment of suicide and violence risk factors was performed as part of this evaluation and is not *** significantly changed from the last visit.             While future psychiatric events cannot be accurately predicted, the patient does not *** currently require acute inpatient psychiatric care and does not *** currently meet [place]  involuntary commitment criteria.          Plan:  # PTSD Past medication trials:  Status of problem: *** Interventions: -- continue latuda  60mg  daily with supper  # Intellectual disability # Intermittent Explosive Disorder Past medication trials:  Status of problem: *** Interventions: --continue depakote  ER 2000 mg at bedtime  --04/2023 VPA level <4; 05/2023 VPA level 18  --9/25: depakote  level 102 --hepatic function panel 05/07/23 wnl  # GAD  Past medication trials:  Status of problem: *** Interventions: -- latuda  as above   # Cannabis use with anxiety disorder  Past medication trials:  Status of problem: *** Interventions: -- advise cessation  Return to care in ***  Patient was given contact information for behavioral health clinic and was instructed to call 911 for emergencies.   Patient and plan of care will be discussed with the Attending MD ,Dr. ***, who agrees with the above statement and plan.   Subjective:  Chief Complaint: No chief complaint on  file.   Interval History: *** Labs: CMP, levels, UDS, PDMP  PDMP gabapentin  300mg  90# 30 days, filled 08/11/2023  10/2023 CBC with leukocytosis  10/2023 lipid panel with LDL 115, Vit D/B12 wnl.   03/7972 UDS+THC EKG: 10/2023 Qtc 453, NSR MRI brain / EEG: 10/2023 MRI brain wnl. Sleep study: none Last visit, med changes   -08/2023 with Dr. Lynnette - continue prozac  20mg  daily, continue depakote  ER 2000mg  at bedtime, trazodone  200 at bedtime  Interval notes:  -discharged from [facility] therapy  -went to ED for seizure like activity and they wanted to do inpatient work-up for his seizure including MRI and EEG. MRI brain wnl. EEG not obtained due to patient leaving AMA, wanting outpatient work-up for his seizure like activity.  -11/05/23: patient presented to [facility] after being videotaped by bystander on top of covered walking bridge, actively suicidal. Stressors include conflict with fiance and concern that fiance will leave him. Admitted to [facility] and started on latuda  20mg  at bedtime.  He was discharged on Depakote  2000 at bedtime and Latuda  60 mg with supper and Prozac  was stopped.  Depakote  level 102 prior to discharge.  ? History of medication non-adherence - did he increase depakote  to 2000mg  at bedtime?  -has legal guardian  [ ]  sleep [ ]  appetite  [ ]  medication side effects  [ ]  mood  [ ]  stressors  [ ]  substance use - still using THC 2-3x per week  [ ]  safety    Visit Diagnosis: No diagnosis found.  Past Psychiatric History:  Diagnoses: PTSD, intellectual disability, intermittent explosive disorder, GAD,  cannabis use with anxiety, Bipolar I disorder mixed Medication trials:   prozac  20mg  daily, depakote  ER 2000mg  at bedtime, trazodone  200 at bedtime, gabapentin , clonidine , clonazepam , sertraline , quetiapine  Previous psychiatrist/therapist: Dr. Lynnette Hospitalizations: 4 psych hosp in middle school for anger and depression  Suicide attempts: SI in middle school of putting knife to  neck, immolating self on trampoline, and hanging himself  SIB: denies  Hx of violence towards others: *** Current access to guns: *** Hx of trauma/abuse: prior concussions as child but unable to recall when  Developmental history: ***  Substance Use History: UDS, PDMP EtOH:  reports no history of alcohol use. Nicotine :  reports that he has been smoking. He does not have any smokeless tobacco history on file. cigarettes THC/CBD: sativia and indica for anxiety 2-3 per week IV drug use: denies Stimulants: denies Opiates: denies Sedative/hypnotics: denies Hallucinogens: denies Seizures: denies  Initial note:  Mark [name] is a 33 y.o. male with PMH of IDD, IED, ADD, bipolar disorder, hypertension, type 2 diabetes mellitus c/b Charcot's foot, dyslipidemia , 3 suicide attempt during adolesence, 4 inpt adolescent psych admission, who presented in person for psychiatric evaluation of anxiety along with legal guardian [name]. Patient lives in a group home and has a caregiver. His psychotropics include depakote  DR 1000 mg bid and trazodone  200 mg at bedtime. It appears he had been previously on Depakote  ER likely for impulse control related to IDD vs ADD. Per patient's history, it seems his symptoms are more consistent with Generalized Anxiety Disorder, PTSD, and IED rather than bipolar disorder. His mood swings do not meet criteria for mania or hypomania. Depakote  is still a good option to aid with impulse control and mood lability but patient does not appear to meet criteria for bipolar disorder. We will start fluoxetine  to address his anxiety and PTSD. Plan to keep remainder of medications the same but will get a depakote  level this visit to evaluate for trough level and hepatic function panel. Patient is also seeking to start psychotherapy so she will see [name] on 05/17/23. Patient to follow up with me in 1 month to evaluate for progress.   Past Medical History:  Past Medical  History:  Diagnosis Date   ADHD (attention deficit hyperactivity disorder)    Bipolar 1 disorder (HCC)    Mental retardation     Past Surgical History:  Procedure Laterality Date   NO PAST SURGERIES     Dx:  has a past medical history of ADHD (attention deficit hyperactivity disorder), Bipolar 1 disorder (HCC), and Mental retardation.  Seizures: denies Allergies: Other   Family Psychiatric History: GAD and MDD   Family History:  Family History  Problem Relation Age of Onset   Hypertension Mother    Hyperlipidemia Mother     Social History:  Living with: 1 roommate, landlady Income: maintenance for [employer] Children: none Support: Child psychotherapist, Arboriculturist, nieces, sister Guns/Weapons: denies Legal: denies Developmental: IDD  Substance Use History:   Social History   Socioeconomic History   Marital status: Single    Spouse name: Not on file   Number of children: Not on file   Years of education: Not on file   Highest education level: Not on file  Occupational History   Not on file  Tobacco Use   Smoking status: Every Day    Current packs/day: 1.00    Average packs/day: 1 pack/day for 0.7 years (0.7 ttl pk-yrs)    Types: Cigarettes    Start  date: 2025   Smokeless tobacco: Not on file  Substance and Sexual Activity   Alcohol use: No   Drug use: Yes    Types: Marijuana   Sexual activity: Yes  Other Topics Concern   Not on file  Social History Narrative   ** Merged History Encounter **       Social Drivers of Health   Financial Resource Strain: Low Risk  (02/20/2023)   Received from [facility]   Overall Financial Resource Strain (CARDIA)    Difficulty of Paying Living Expenses: Not hard at all  Food Insecurity: No Food Insecurity (11/13/2023)   Hunger Vital Sign    Worried About Running Out of Food in the Last Year: Never true    Ran Out of Food in the Last Year: Never true  Transportation Needs: No Transportation Needs (11/13/2023)   PRAPARE -  Administrator, Civil Service (Medical): No    Lack of Transportation (Non-Medical): No  Physical Activity: Sufficiently Active (02/20/2023)   [facility]   Exercise Vital Sign    On average, how many days per week do you engage in moderate to strenuous exercise (like a brisk walk)?: 5 days    On average, how many minutes do you engage in exercise at this level?: 120 min  Stress: Stress Concern Present (02/20/2023)   Received from [facility] - Occupational Stress Questionnaire    Feeling of Stress : To some extent  Social Connections: Somewhat Isolated (02/20/2023)   Received from [facility]   Social Network    How would you rate your social network (family, work, friends)?: Restricted participation with some degree of social isolation    Allergies:  No Known Allergies   Current Medications: Current Outpatient Medications  Medication Sig Dispense Refill   divalproex  (DEPAKOTE  ER) 500 MG 24 hr tablet Take 4 tablets (2,000 mg total) by mouth at bedtime. 120 tablet 0   hydrOXYzine  (ATARAX ) 10 MG tablet Take 1 tablet (10 mg total) by mouth 3 (three) times daily as needed for anxiety. 60 tablet 0   hydrOXYzine  (ATARAX ) 25 MG tablet Take 1 tablet (25 mg total) by mouth in the morning. 30 tablet 0   lurasidone  (LATUDA ) 20 MG TABS tablet Take 3 tablets (60 mg total) by mouth daily with supper. 90 tablet 0   traZODone  (DESYREL ) 100 MG tablet Take 2 tablets (200 mg total) by mouth at bedtime. 14 tablet 0   No current facility-administered medications for this visit.    ROS: Review of Systems  Objective:  Psychiatric Specialty Exam: There were no vitals taken for this visit.There is no height or weight on file to calculate BMI.  General Appearance: {Appearance:22683}  Eye Contact:  {BHH EYE CONTACT:22684}  Speech:  {Speech:22685}  Volume:  {Volume (PAA):22686}  Mood:  {BHH MOOD:22306}  Affect:  {Affect  (PAA):22687}  Thought Content: {Thought Content:22690}   Suicidal Thoughts:  {ST/HT (PAA):22692}  Homicidal Thoughts:  {ST/HT (PAA):22692}  Thought Process:  {Thought Process (PAA):22688}  Orientation:  {BHH ORIENTATION (PAA):22689}    Memory: Grossly intact ***  Judgment:  {Judgement (PAA):22694}  Insight:  {Insight (PAA):22695}  Concentration:  {Concentration:21399}  Recall: not formally assessed ***  Fund of Knowledge: {BHH GOOD/FAIR/POOR:22877}  Language: {BHH GOOD/FAIR/POOR:22877}  Psychomotor Activity:  {Psychomotor (PAA):22696}  Akathisia:  {BHH YES OR NO:22294}  AIMS (if indicated): {Desc; done/not:10129}  Assets:  {Assets (PAA):22698}  ADL's:  {BHH JIO'D:77709}  Cognition: {chl bhh  cognition:304700322}  Sleep:  {BHH GOOD/FAIR/POOR:22877}   PE: General: well-appearing; no acute distress *** Pulm: no increased work of breathing on room air *** Strength & Muscle Tone: {desc; muscle tone:32375} Neuro: no focal neurological deficits observed *** Gait & Station: {PE GAIT ED WJUO:77474}  Metabolic Disorder Labs: Lab Results  Component Value Date   HGBA1C 5.2 11/05/2023   MPG 102.54 11/05/2023   MPG 154 09/04/2014   No results found for: PROLACTIN Lab Results  Component Value Date   CHOL 185 11/06/2023   TRIG 148 11/06/2023   HDL 40 (L) 11/06/2023   CHOLHDL 4.6 11/06/2023   VLDL 30 11/06/2023   LDLCALC 115 (H) 11/06/2023   LDLCALC 77 11/05/2023   Lab Results  Component Value Date   TSH 1.259 11/12/2023   TSH 0.691 11/05/2023    Therapeutic Level Labs: No results found for: LITHIUM Lab Results  Component Value Date   VALPROATE 102 (H) 11/12/2023   VALPROATE 93 11/05/2023   No results found for: CBMZ  Screenings:  AUDIT    Flowsheet Row [facility] Ad[facility] (Discharged[facility]  Alcohol Use Disorder Identification  Test Final Score (AUDIT) 0 0   Flowsheet Row Ad[facility]) [facility] Most recent reading at 11/13/2023  5:00 PM ED from 11/12/2023 in [facility] Most recent reading at 11/12/2023 10:46 PM ED from 11/12/2023 in [facility] Most recent reading at 11/12/2023  4:50 PM  C-SSRS RISK CATEGORY High Risk High Risk High Risk    Collaboration of Care: Collaboration of Care: [facility] OP Collaboration of Care:[id number]}  Patient/Guardian was advised Release of Information must be obtained prior to any record release in order to collaborate their care with an outside provider. Patient/Guardian was advised if they have not already done so to contact the registration department to sign all necessary forms in order for us  to release information regarding their care.   Consent: Patient/Guardian gives verbal consent for treatment and assignment of benefits for services provided during this visit. Patient/Guardian expressed understanding and agreed to proceed.   Corean Minor, MD, PGY-3 11/28/2023, 7:40 AM

## 2023-11-28 ENCOUNTER — Ambulatory Visit (HOSPITAL_COMMUNITY): Admitting: Psychiatry

## 2023-11-28 ENCOUNTER — Encounter (HOSPITAL_COMMUNITY): Payer: Self-pay

## 2023-11-28 ENCOUNTER — Ambulatory Visit (HOSPITAL_COMMUNITY)
Admission: EM | Admit: 2023-11-28 | Discharge: 2023-11-29 | Disposition: A | Discharge status: Other Acute Inpatient Hospital

## 2023-11-28 DIAGNOSIS — F314 Bipolar disorder, current episode depressed, severe, without psychotic features: Secondary | ICD-10-CM | POA: Insufficient documentation

## 2023-11-28 DIAGNOSIS — T1491XA Suicide attempt, initial encounter: Secondary | ICD-10-CM | POA: Diagnosis present

## 2023-11-28 DIAGNOSIS — F4310 Post-traumatic stress disorder, unspecified: Secondary | ICD-10-CM | POA: Diagnosis not present

## 2023-11-28 DIAGNOSIS — Z79899 Other long term (current) drug therapy: Secondary | ICD-10-CM | POA: Diagnosis not present

## 2023-11-28 DIAGNOSIS — Z638 Other specified problems related to primary support group: Secondary | ICD-10-CM | POA: Diagnosis present

## 2023-11-28 DIAGNOSIS — F909 Attention-deficit hyperactivity disorder, unspecified type: Secondary | ICD-10-CM | POA: Insufficient documentation

## 2023-11-28 DIAGNOSIS — F319 Bipolar disorder, unspecified: Secondary | ICD-10-CM | POA: Diagnosis present

## 2023-11-28 LAB — URINALYSIS, ROUTINE W REFLEX MICROSCOPIC
Bilirubin Urine: NEGATIVE
Glucose, UA: NEGATIVE mg/dL
Hgb urine dipstick: NEGATIVE
Ketones, ur: NEGATIVE mg/dL
Leukocytes,Ua: NEGATIVE
Nitrite: NEGATIVE
Protein, ur: NEGATIVE mg/dL
Specific Gravity, Urine: 1.004 — ABNORMAL LOW (ref 1.005–1.030)
pH: 5 (ref 5.0–8.0)

## 2023-11-28 LAB — POCT URINE DRUG SCREEN - MANUAL ENTRY (I-SCREEN)
POC Amphetamine UR: NOT DETECTED
POC Buprenorphine (BUP): NOT DETECTED
POC Cocaine UR: NOT DETECTED
POC Marijuana UR: POSITIVE — AB
POC Methadone UR: NOT DETECTED
POC Methamphetamine UR: NOT DETECTED
POC Morphine: NOT DETECTED
POC Oxazepam (BZO): NOT DETECTED
POC Oxycodone UR: NOT DETECTED
POC Secobarbital (BAR): NOT DETECTED

## 2023-11-28 LAB — VALPROIC ACID LEVEL: Valproic Acid Lvl: <10 ug/mL — ABNORMAL LOW (ref 50–100)

## 2023-11-28 MED ORDER — HALOPERIDOL LACTATE 5 MG/ML IJ SOLN: 10.0000 mg | Freq: Three times a day (TID) | INTRAMUSCULAR | Status: DC | PRN

## 2023-11-28 MED ORDER — LORAZEPAM 2 MG/ML IJ SOLN: 2.0000 mg | Freq: Three times a day (TID) | INTRAMUSCULAR | Status: DC | PRN

## 2023-11-28 MED ORDER — MAGNESIUM HYDROXIDE 400 MG/5ML PO SUSP: 30.0000 mL | Freq: Every day | ORAL | Status: DC | PRN

## 2023-11-28 MED ORDER — ACETAMINOPHEN 325 MG PO TABS: 650.0000 mg | ORAL_TABLET | Freq: Four times a day (QID) | ORAL | Status: DC | PRN

## 2023-11-28 MED ORDER — ALUM & MAG HYDROXIDE-SIMETH 200-200-20 MG/5ML PO SUSP: 30.0000 mL | ORAL | Status: DC | PRN

## 2023-11-28 MED ORDER — HALOPERIDOL 5 MG PO TABS: 5.0000 mg | ORAL_TABLET | Freq: Three times a day (TID) | ORAL | Status: DC | PRN

## 2023-11-28 MED ORDER — TRAZODONE HCL 50 MG PO TABS
50.0000 mg | ORAL_TABLET | Freq: Every evening | ORAL | Status: DC | PRN
  Administered 2023-11-29: 50 mg via ORAL
  Filled 2023-11-28: qty 1

## 2023-11-28 MED ORDER — DIPHENHYDRAMINE HCL 50 MG/ML IJ SOLN: 50.0000 mg | Freq: Three times a day (TID) | INTRAMUSCULAR | Status: DC | PRN

## 2023-11-28 MED ORDER — HALOPERIDOL LACTATE 5 MG/ML IJ SOLN: 5.0000 mg | Freq: Three times a day (TID) | INTRAMUSCULAR | Status: DC | PRN

## 2023-11-28 MED ORDER — DIPHENHYDRAMINE HCL 50 MG PO CAPS: 50.0000 mg | ORAL_CAPSULE | Freq: Three times a day (TID) | ORAL | Status: DC | PRN

## 2023-11-28 NOTE — ED Notes (Signed)
 Pt admitted to observation unit endorsing suicide gesture via jumping off bridge but states his God-mother talked him down off the bridge. Pt states, MY girlfriend decided she wants to go back to her husband and just leave me. That almost took me over the edge. Pt denies HI/AVH. Pt reports being depressed. At current, pt continue to endorse SI no plan or intent. Pt states, I knew I had to check myself into somewhere. Praise and encouragement provided. Calm, cooperative throughout interview process. Skin assessment completed. Oriented to unit. Meal and drink offered. Pt verbally contract for safety. Will monitor for safety.

## 2023-11-28 NOTE — ED Notes (Signed)
 Pt is calm, sitting in the day room watching TV. Pt denies having any complaints at this time. Staff will continue to monitor pt for safety.

## 2023-11-28 NOTE — ED Provider Notes (Signed)
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 range applies only to samples taken after fasting for at least 8 hours.   BUN 11/02/2023 11  6 - 20 mg/dL Final   Creatinine, Ser 11/02/2023 0.91  0.61 - 1.24 mg/dL Final   Calcium 91/77/7974 9.1  8.9 - 10.3 mg/dL Final   Total Protein 91/77/7974 6.0 (L)  6.5 - 8.1 g/dL Final   Albumin 91/77/7974 3.1 (L)  3.5 - 5.0 g/dL Final   AST 91/77/7974 26  15 - 41 U/L Final   ALT 11/02/2023 22  0 -  44 U/L Final   Alkaline Phosphatase 11/02/2023 52  38 - 126 U/L Final   Total Bilirubin 11/02/2023 0.5  0.0 - 1.2 mg/dL Final   GFR, Estimated 11/02/2023 >60  >60 mL/min Final   Comment: (NOTE) Calculated using the CKD-EPI Creatinine Equation (2021)    Anion gap 11/02/2023 10  5 - 15 Final   Performed at [facility], [address] [place], [place] [address]   Alcohol, Ethyl (B) 11/02/2023 <15  <15 mg/dL Final   Comment: (NOTE) For medical purposes only. Performed at [facility], [address] [place], [place] [address]    WBC 11/02/2023 12.9 (H)  4.0 - 10.5 K/uL Final   RBC 11/02/2023 5.30  4.22 - 5.81 MIL/uL Final   Hemoglobin 11/02/2023 15.7  13.0 - 17.0 g/dL Final   HCT 91/77/7974 47.1  39.0 - 52.0 % Final   MCV 11/02/2023 88.9  80.0 - 100.0 fL Final   MCH 11/02/2023 29.6  26.0 - 34.0 pg Final   MCHC 11/02/2023 33.3  30.0 - 36.0 g/dL Final   RDW 91/77/7974 12.1  11.5 - 15.5 % Final   Platelets 11/02/2023 347  150 - 400 K/uL Final   nRBC 11/02/2023 0.0  0.0 - 0.2 % Final   Neutrophils Relative % 11/02/2023 62  % Final   Neutro Abs 11/02/2023 8.1 (H)  1.7 - 7.7 K/uL Final   Lymphocytes Relative 11/02/2023 23  % Final  Lymphs Abs 11/02/2023 2.9  0.7 - 4.0 K/uL Final   Monocytes Relative 11/02/2023 10  % Final   Monocytes Absolute 11/02/2023 1.3 (H)  0.1 - 1.0 K/uL Final   Eosinophils Relative 11/02/2023 3  % Final   Eosinophils Absolute 11/02/2023 0.4  0.0 - 0.5 K/uL Final   Basophils Relative 11/02/2023 1  % Final   Basophils Absolute 11/02/2023 0.1  0.0 - 0.1 K/uL Final   Immature Granulocytes 11/02/2023 1  % Final   Abs Immature Granulocytes 11/02/2023 0.08 (H)  0.00 - 0.07 K/uL Final   Performed at [facility], [address] [place], [place] [address]   Valproic Acid  Lvl 11/02/2023 16 (L)  50 - 100 ug/mL Final   Performed at [facility], [address], [place], [place] [address]  Office Visit on 06/06/2023  Component Date Value Ref  Range Status   Valproic Acid  Lvl 06/06/2023 18 (L)  50 - 100 ug/mL Final   Comment:                                 Detection Limit = 4                            <4 indicates None Detected Toxicity may occur at levels of 100-500. Measurements of free unbound valproic acid  may improve the assess- ment of clinical response.     Allergies: Patient has no known allergies.  Medications:  PTA Medications  Medication Sig   traZODone  (DESYREL ) 100 MG tablet Take 2 tablets (200 mg total) by mouth at bedtime.   divalproex  (DEPAKOTE  ER) 500 MG 24 hr tablet Take 4 tablets (2,000 mg total) by mouth at bedtime.   hydrOXYzine  (ATARAX ) 25 MG tablet Take 1 tablet (25 mg total) by mouth in the morning.   hydrOXYzine  (ATARAX ) 10 MG tablet Take 1 tablet (10 mg total) by mouth 3 (three) times daily as needed for anxiety.   lurasidone  (LATUDA ) 20 MG TABS tablet Take 3 tablets (60 mg total) by mouth daily with supper.      Medical Decision Making  Admission to observation unit. Inpatient recommended Agitation protocol ordered EKG  Acetaminophen  650 mg PO Q 6 PRN Maalox 30 ml PO Q  4 PRN Milk of Magnesia 30 ml PO  Daily PRN Trazodone  50 mg po HS PRN  Labs: UA, UDS, Valproic Acid  level    Recommendations  Based on my evaluation the patient does not appear to have an emergency medical condition.  Randall Bouquet, NP 11/28/23  4:10 PM
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
## 2023-11-28 NOTE — ED Notes (Signed)
 Received call from [facility], Deon, intake specialist, with info on pt for pending acceptance to their facility. Writer was informed that pt was not a candidate for their admission due to pt having a diagnosis of mental retardation in chart. Provider/SW made aware. Pt sitting on bed eating meal. Safety maintained.
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
## 2023-11-28 NOTE — Progress Notes (Signed)
 Mark [name]  MRN: [id number]   Inpatient Placement  Starlyn Patron, NP, has recommended inpatient treatment for the patient. Per guidance from [facility] Access Coordinator Danika, RN, the patient's referral information was faxed to the following facilities for consideration of bed placement:
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
## 2023-11-29 DIAGNOSIS — T1491XA Suicide attempt, initial encounter: Secondary | ICD-10-CM | POA: Diagnosis not present

## 2023-11-29 MED ORDER — NICOTINE POLACRILEX 2 MG MT GUM
2.0000 mg | CHEWING_GUM | OROMUCOSAL | Status: DC | PRN
  Administered 2023-11-29: 2 mg via ORAL
  Filled 2023-11-29: qty 1

## 2023-11-29 MED ORDER — NICOTINE POLACRILEX 2 MG MT GUM: 2.0000 mg | CHEWING_GUM | OROMUCOSAL | 0 refills | Status: AC | PRN

## 2023-11-29 MED ORDER — ALUM & MAG HYDROXIDE-SIMETH 200-200-20 MG/5ML PO SUSP: 30.0000 mL | ORAL | 0 refills | Status: AC | PRN

## 2023-11-29 MED ORDER — TRAZODONE HCL 50 MG PO TABS: 50.0000 mg | ORAL_TABLET | Freq: Every evening | ORAL | Status: DC | PRN

## 2023-11-29 MED ORDER — MAGNESIUM HYDROXIDE 400 MG/5ML PO SUSP: 30.0000 mL | Freq: Every day | ORAL | 0 refills | Status: AC | PRN

## 2023-11-29 NOTE — Progress Notes (Signed)
 LCSW Progress Note  [id number]   Mark [name]  11/29/2023  10:48 AM  Description:   Inpatient Psychiatric Referral  Patient was recommended inpatient per Starlyn Patron, NP (NP). There are no available beds at [facility], per [facility] Noberto Qua RN). Patient was referred to the following out of network facilities:  [facility] Provider Address Phone Fax  [facility]  [address], [place] [address] [phone]  [facility] [address]  [address] [place] [address] [phone]  [facility]  [address], [place] [address] [phone]  [facility]  [address] [place], [place] [address] [phone]  [facility]  [address] [place], [place] [address] [phone]  [facility]  [address] [place] [address] [phone]  [facility]  [address] [place] [address] [phone]  [facility]  [address], [place] [address] [phone]  [facility]  [address] [place] [address] [phone]  [facility]  [address], [place] [address] [phone]  [facility]  [address] [place] [address] [phone]  [facility]  [address], [place] [address] [phone]  [facility]  [place] [phone]  [facility]  [address], [place] [address] [phone]  [facility]  [address], [place] [address] [phone]  [facility]  [address] [place] [address] [phone]  [facility]  [address] [place] [phone]  [facility]  [address] [place] [address], [place] [address] [phone]  [facility]  [address] [place] [address] [phone]  [facility]  [address] [place] [address] [phone]  [facility]  [address], [place] [address] [phone]  [facility]  [address] [place] [address] [phone]  [facility]  [address] [place] [address] [phone]  [facility]  [address] [place] [address] [phone]  [facility]  [address] [facility] [place] [place] [address] [phone]  [facility] [place] [address] [phone]  [facility]  [place] [phone] --      Situation ongoing, CSW to continue following and update chart as more information becomes available.      [place] Vencent Hauschild MSW, LCSW  11/29/2023 10:48 AM

## 2023-11-29 NOTE — Progress Notes (Signed)
 Pt has been accepted to [facility] on 11/29/2023 Bed assignment: TBD  Pt meets inpatient criteria per: Starlyn Patron NP  Attending Physician will be: Oneil Charleston ND  Report can be called to: [phone]  Pt can arrive ASAP   Care Team Notified: Lynwood Bash MD, [facility] RN  [place] Seniah Lawrence LCSW-A   11/29/2023 11:04 AM
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
## 2023-11-29 NOTE — Discharge Summary (Signed)
 Mark [name] to be discharged to [facility] per MD order. Discussed with the patient and all questions fully answered. An After Visit Summary, EMTALA and Med Necessity forms were printed and to be given to the receiving nurse. All belongings returned. Patient escorted out and discharged via safe transport. [name]  11/29/2023 1:03 PM

## 2023-11-29 NOTE — Discharge Instructions (Addendum)
 To [facility]

## 2023-11-29 NOTE — ED Notes (Signed)
 Pt is asleep in recliner, respirations even and unlabored, no distress noted. Staff will continue to monitor pt for safety.

## 2023-11-29 NOTE — Progress Notes (Signed)
 Report called to [place], RN, [facility].

## 2023-11-29 NOTE — Progress Notes (Signed)
 Pt is awake, alert and oriented X4. Pt did not voice any complaints of pain or discomfort. No signs of acute distress noted. Pt endorses SI with plan to jump off a bridge. Pt verbally contract for safety on the unit. Pt advised to notify staff when having intrusive thoughts of hurting self or others. Pt verbalized understanding. Pt denies current HI/AVH. Staff will monitor for pt's safety.
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
## 2023-12-09 ENCOUNTER — Emergency Department (HOSPITAL_COMMUNITY)
Admission: EM | Admit: 2023-12-09 | Discharge: 2023-12-09 | Disposition: A | Discharge status: Home / Self Care | Attending: Emergency Medicine | Admitting: Emergency Medicine

## 2023-12-09 ENCOUNTER — Emergency Department (HOSPITAL_COMMUNITY)

## 2023-12-09 ENCOUNTER — Other Ambulatory Visit: Payer: Self-pay

## 2023-12-09 DIAGNOSIS — R519 Headache, unspecified: Secondary | ICD-10-CM | POA: Diagnosis not present

## 2023-12-09 DIAGNOSIS — R569 Unspecified convulsions: Secondary | ICD-10-CM | POA: Diagnosis present

## 2023-12-09 DIAGNOSIS — R251 Tremor, unspecified: Secondary | ICD-10-CM | POA: Diagnosis not present

## 2023-12-09 LAB — CBC WITH DIFFERENTIAL/PLATELET
Abs Immature Granulocytes: 0.08 K/uL — ABNORMAL HIGH (ref 0.00–0.07)
Basophils Absolute: 0.1 K/uL (ref 0.0–0.1)
Basophils Relative: 1 %
Eosinophils Absolute: 0.4 K/uL (ref 0.0–0.5)
Eosinophils Relative: 2 %
HCT: 48.6 % (ref 39.0–52.0)
Hemoglobin: 16.3 g/dL (ref 13.0–17.0)
Immature Granulocytes: 1 %
Lymphocytes Relative: 12 %
Lymphs Abs: 1.8 K/uL (ref 0.7–4.0)
MCH: 29.7 pg (ref 26.0–34.0)
MCHC: 33.5 g/dL (ref 30.0–36.0)
MCV: 88.7 fL (ref 80.0–100.0)
Monocytes Absolute: 1.7 K/uL — ABNORMAL HIGH (ref 0.1–1.0)
Monocytes Relative: 11 %
Neutro Abs: 11.5 K/uL — ABNORMAL HIGH (ref 1.7–7.7)
Neutrophils Relative %: 73 %
Platelets: 267 K/uL (ref 150–400)
RBC: 5.48 MIL/uL (ref 4.22–5.81)
RDW: 12.7 % (ref 11.5–15.5)
WBC: 15.5 K/uL — ABNORMAL HIGH (ref 4.0–10.5)
nRBC: 0 % (ref 0.0–0.2)

## 2023-12-09 LAB — BASIC METABOLIC PANEL WITH GFR
Anion gap: 12 (ref 5–15)
BUN: 11 mg/dL (ref 6–20)
CO2: 24 mmol/L (ref 22–32)
Calcium: 9.1 mg/dL (ref 8.9–10.3)
Chloride: 101 mmol/L (ref 98–111)
Creatinine, Ser: 0.87 mg/dL (ref 0.61–1.24)
GFR, Estimated: >60 mL/min (ref >60)
Glucose, Bld: 126 mg/dL — ABNORMAL HIGH (ref 70–99)
Potassium: 4.7 mmol/L (ref 3.5–5.1)
Sodium: 137 mmol/L (ref 135–145)

## 2023-12-09 LAB — ETHANOL: Alcohol, Ethyl (B): <15 mg/dL (ref <15)

## 2023-12-09 LAB — VALPROIC ACID LEVEL: Valproic Acid Lvl: 54 ug/mL (ref 50–100)

## 2023-12-09 MED ORDER — ACETAMINOPHEN 500 MG PO TABS
1000.0000 mg | ORAL_TABLET | Freq: Once | ORAL | Status: AC
  Administered 2023-12-09: 1000 mg via ORAL
  Filled 2023-12-09: qty 2

## 2023-12-09 NOTE — ED Notes (Signed)
 Legal guardian Lasha Hickman returned call and informed of pt in the ED and that he was being discharged.

## 2023-12-09 NOTE — ED Notes (Signed)
 Pt ambulatory to bathroom

## 2023-12-09 NOTE — ED Notes (Signed)
 Patient transported to CT

## 2023-12-09 NOTE — Discharge Instructions (Signed)
 Return for any problem.  ?

## 2023-12-09 NOTE — ED Triage Notes (Signed)
 Pt BIB [facility] from home. Pt diagnosed with seizures x1 month ago. While on the phone with GF told her he thought he was going to have seizure. She called EMS. Hit hit when he fell. 134 HR when EMS arrived. CBG 155 140/80 96RA 100 HR. Takes depakote  daily. 20g L AC. A&Ox4.

## 2023-12-09 NOTE — ED Notes (Signed)
 Attempted to contact Legal Guardian [name]. Voicemail left to return call to RN.

## 2023-12-09 NOTE — ED Provider Notes (Signed)
 [place] EMERGENCY DEPARTMENT AT [facility] Provider Note   CSN: [id number] Arrival date & time: 12/09/23  [id number]     Patient presents with: Seizures   Mark [name] is a 33 y.o. adult.   33 year old male with past medical history significant for general anxiety disorder, PTSD, intellectual disability, and possible seizure disorder.  Patient reports that this morning he was awake and starting his day.  He lives with his girlfriend.  He felt like he might have a seizure.  Shortly thereafter he had about 2 minutes of generalized shaking.  He recalls the entire event.  He did hit his head as he fell.  He complains of mild headache now.  He denies neck pain.  He is ambulatory without difficulty.  He has been taking his Depakote  as previously prescribed.  He denies other recent issues or complaint.  He denies fever.  He denies chest pain or shortness of breath.  He is alert and oriented on initial evaluation.  He is reporting that he feels much improved after EMS transport.  He is requesting some Tylenol  for his headache.  The history is provided by the patient and medical records.       Prior to Admission medications   Medication Sig Start Date End Date Taking? Authorizing Provider  alum & mag hydroxide-simeth (MAALOX/MYLANTA) 200-200-20 MG/5ML suspension Take 30 mLs by mouth every 4 (four) hours as needed for indigestion. 11/29/23   Delsie Lynwood Morene Lavone, MD  divalproex  (DEPAKOTE  ER) 500 MG 24 hr tablet Take 4 tablets (2,000 mg total) by mouth at bedtime. 11/10/23 02/08/24  Kennyth Starleen RAMAN, MD  hydrOXYzine  (ATARAX ) 10 MG tablet Take 1 tablet (10 mg total) by mouth 3 (three) times daily as needed for anxiety. Patient taking differently: Take 10 mg by mouth at bedtime. 11/20/23   Lera Nancyann NOVAK, DO  Lurasidone  HCl 60 MG TABS Take 60 mg by mouth at bedtime. 11/20/23   [provider]  magnesium  hydroxide (MILK OF MAGNESIA) 400 MG/5ML suspension Take 30 mLs by  mouth daily as needed for mild constipation. 11/29/23   Delsie Lynwood Morene Lavone, MD  nicotine  polacrilex (NICORETTE ) 2 MG gum Take 1 each (2 mg total) by mouth as needed for smoking cessation. 11/29/23   Delsie Lynwood Morene Lavone, MD  traZODone  (DESYREL ) 50 MG tablet Take 1 tablet (50 mg total) by mouth at bedtime as needed for sleep. 11/29/23   Delsie Lynwood Morene Lavone, MD    Allergies: Patient has no known allergies.    Review of Systems  All other systems reviewed and are negative.   Updated Vital Signs Ht 6' 2 (1.88 m)   Wt 96.2 kg   BMI 27.22 kg/m   Physical Exam Vitals and nursing note reviewed.  Constitutional:      General: He is not in acute distress.    Appearance: Normal appearance. He is well-developed.  HENT:     Head: Normocephalic and atraumatic.  Eyes:     Conjunctiva/sclera: Conjunctivae normal.     Pupils: Pupils are equal, round, and reactive to light.  Cardiovascular:     Rate and Rhythm: Normal rate and regular rhythm.     Heart sounds: Normal heart sounds.  Pulmonary:     Effort: Pulmonary effort is normal. No respiratory distress.     Breath sounds: Normal breath sounds.  Abdominal:     General: There is no distension.     Palpations: Abdomen is soft.     Tenderness:  There is no abdominal tenderness.  Musculoskeletal:        General: No deformity. Normal range of motion.     Cervical back: Normal range of motion and neck supple.  Skin:    General: Skin is warm and dry.  Neurological:     General: No focal deficit present.     Mental Status: He is alert and oriented to person, place, and time.     (all labs ordered are listed, but only abnormal results are displayed) Labs Reviewed  CBC WITH DIFFERENTIAL/PLATELET  BASIC METABOLIC PANEL WITH GFR  ETHANOL  VALPROIC ACID  LEVEL    EKG: None  Radiology: No results found.   Procedures   Medications Ordered in the ED  acetaminophen  (TYLENOL ) tablet 1,000 mg (has no  administration in time range)                                    Medical Decision Making Patient is presenting with reported seizure-like activity.  Patient recalls the entire event.  He was not incontinent of urine.  He did not bite his tongue.  It is somewhat unclear whether he had a seizure.  That being said, patient is alert and oriented and at baseline on arrival.  He is comfortable.  He has minimal acute complaint.  Screening labs and imaging obtained are without acute abnormality.  Patient observed for several hours in the ED.  He is without evidence of ongoing acute process.  Patient feels improved on reevaluation.  He desires discharge home.  He understands need for close follow-up.  He understands need to continue to take his previously prescribed medications including his Depakote .  Importance of close follow-up is stressed.  Strict return precautions given and understood.  Amount and/or Complexity of Data Reviewed Labs: ordered. Radiology: ordered.  Risk OTC drugs.        Final diagnoses:  Seizure-like activity [facility])    ED Discharge Orders     None          Laurice Maude BROCKS, MD 12/09/23 1014

## 2023-12-09 NOTE — ED Notes (Signed)
 Taxi voucher given to pt. [employer] called at this time

## 2023-12-10 ENCOUNTER — Ambulatory Visit (HOSPITAL_BASED_OUTPATIENT_CLINIC_OR_DEPARTMENT_OTHER): Admitting: Psychiatry

## 2023-12-10 VITALS — BP 130/75 | HR 90

## 2023-12-10 DIAGNOSIS — F79 Unspecified intellectual disabilities: Secondary | ICD-10-CM

## 2023-12-10 DIAGNOSIS — F12980 Cannabis use, unspecified with anxiety disorder: Secondary | ICD-10-CM | POA: Diagnosis not present

## 2023-12-10 DIAGNOSIS — F4310 Post-traumatic stress disorder, unspecified: Secondary | ICD-10-CM

## 2023-12-10 DIAGNOSIS — F639 Impulse disorder, unspecified: Secondary | ICD-10-CM

## 2023-12-10 MED ORDER — LURASIDONE HCL 60 MG PO TABS: 60.0000 mg | ORAL_TABLET | Freq: Every day | ORAL | 2 refills | Status: DC

## 2023-12-10 MED ORDER — DIVALPROEX SODIUM ER 500 MG PO TB24: 2000.0000 mg | ORAL_TABLET | Freq: Every day | ORAL | 2 refills | Status: DC

## 2023-12-10 MED ORDER — TRAZODONE HCL 50 MG PO TABS: 200.0000 mg | ORAL_TABLET | Freq: Every day | ORAL | 2 refills | Status: DC

## 2023-12-10 MED ORDER — HYDROXYZINE HCL 25 MG PO TABS: 25.0000 mg | ORAL_TABLET | Freq: Three times a day (TID) | ORAL | 2 refills | Status: DC | PRN

## 2023-12-10 NOTE — Addendum Note (Signed)
 Addended by: CARVIN CROCK on: 12/10/2023 03:34 PM   Modules accepted: Level of Service

## 2024-01-12 ENCOUNTER — Encounter (HOSPITAL_COMMUNITY): Payer: Self-pay | Admitting: Emergency Medicine

## 2024-01-12 ENCOUNTER — Other Ambulatory Visit: Payer: Self-pay

## 2024-01-12 ENCOUNTER — Ambulatory Visit (HOSPITAL_COMMUNITY)
Admission: EM | Admit: 2024-01-12 | Discharge: 2024-01-12 | Disposition: A | Discharge status: Home / Self Care | Attending: Internal Medicine | Admitting: Internal Medicine

## 2024-01-12 DIAGNOSIS — L232 Allergic contact dermatitis due to cosmetics: Secondary | ICD-10-CM

## 2024-01-12 MED ORDER — PREDNISONE 20 MG PO TABS: 40.0000 mg | ORAL_TABLET | Freq: Every day | ORAL | 0 refills | Status: AC

## 2024-01-12 MED ORDER — TRIAMCINOLONE ACETONIDE 0.1 % EX CREA: 1.0000 | TOPICAL_CREAM | Freq: Two times a day (BID) | CUTANEOUS | 0 refills | Status: AC

## 2024-01-12 NOTE — Discharge Instructions (Addendum)
 Symptoms and physical exam findings are most consistent with an allergic reaction to a cosmetic product.  This is called contact dermatitis.  Due to the progressive worsening we will treat with both topical steroids and steroids by mouth.  We will treat with the following: Prednisone  40 mg (2 tablets) once daily for 5 days. Take this in the morning.  This is a steroid to help with inflammation and pain. Triamcinolone cream twice daily to the affected area twice daily for 7 days for itching/rash.  Do not apply this to the neck or face.  Use mild soap and avoid scented products on the skin Return to urgent care or PCP if symptoms worsen or fail to resolve.

## 2024-01-12 NOTE — ED Triage Notes (Signed)
 Rash to both arms for 7 days.  Patient thinks this is related to an oil cologne.  Rash itches.  Has not used any medications for symptoms

## 2024-01-12 NOTE — ED Provider Notes (Signed)
 [facility]    CSN: [id number] Arrival date & time: 01/12/24  1114      History   Chief Complaint Chief Complaint  Patient presents with   Rash    HPI Mark [name] is a 33 y.o. male.   33 year old male presents urgent care with complaints of an itchy rash on both forms.  This started about a week ago and has worsened.  He reports he put a type of cologne oil on both forearms and started to have a reaction.  It has spread to multiple different areas on his arms and is very itchy.  He has not used any medication on it so far.   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 Authorizing Provider  predniSONE  (DELTASONE ) 20 MG tablet Take 2 tablets (40 mg total) by mouth daily with breakfast for 5 days. 01/12/24 01/17/24 Yes Margrete Delude A, PA-C  triamcinolone cream (KENALOG) 0.1 % Apply 1 Application topically 2 (two) times daily. For 7 days 01/12/24  Yes Raelyn Racette A, PA-C  alum & mag hydroxide-simeth (MAALOX/MYLANTA) 200-200-20  MG/5ML suspension Take 30 mLs by mouth every 4 (four) hours as needed for indigestion. 11/29/23   Delsie Lynwood Morene Lavone, MD  divalproex  (DEPAKOTE  ER) 500 MG 24 hr tablet Take 4 tablets (2,000 mg total) by mouth at bedtime. 12/10/23 03/09/24  Chien, Stephanie, MD  hydrOXYzine  (ATARAX ) 25 MG tablet Take 1 tablet (25 mg total) by mouth 3 (three) times daily as needed. 12/10/23 03/09/24  Chien, Stephanie, MD  Lurasidone  HCl 60 MG TABS Take 1 tablet (60 mg total) by mouth daily with breakfast. Take with at least 350 calories 12/10/23 03/09/24  Graham Krabbe, MD  magnesium  hydroxide (MILK OF MAGNESIA) 400 MG/5ML suspension Take 30 mLs by mouth daily as needed for mild constipation. 11/29/23   Delsie Lynwood Morene Lavone, MD  nicotine  polacrilex (NICORETTE ) 2 MG gum Take 1 each (2 mg total) by mouth as needed for smoking cessation. 11/29/23   Delsie Lynwood Morene Lavone, MD  traZODone  (DESYREL ) 50 MG tablet Take 4 tablets (200 mg total) by mouth at bedtime. 12/10/23 03/09/24  Graham Krabbe, MD    Family History Family History  Problem Relation Age of Onset   Hypertension Mother    Hyperlipidemia Mother     Social History Social History   Tobacco Use   Smoking status: Every Day    Current packs/day: 1.00    Average packs/day: 1 pack/day for 0.8 years (0.8 ttl  pk-yrs)    Types: Cigarettes    Start date: 2025  Vaping Use   Vaping status: Some Days  Substance Use Topics   Alcohol use: No   Drug use: Yes    Types: Marijuana     Allergies   Patient has no known allergies.   Review of Systems Review of Systems  Constitutional:  Negative for chills and fever.  HENT:  Negative for ear pain and sore throat.   Eyes:  Negative for pain and visual disturbance.  Respiratory:  Negative for cough and shortness of breath.   Cardiovascular:  Negative for chest pain and palpitations.  Gastrointestinal:  Negative for abdominal pain and vomiting.  Genitourinary:  Negative for  dysuria and hematuria.  Musculoskeletal:  Negative for arthralgias and back pain.  Skin:  Positive for rash. Negative for color change.  Neurological:  Negative for seizures and syncope.  All other systems reviewed and are negative.    Physical Exam Triage Vital Signs ED Triage Vitals  Encounter Vitals Group     BP 01/12/24 1239 112/68     Girls Systolic BP Percentile --      Girls Diastolic BP Percentile --      Boys Systolic BP Percentile --      Boys Diastolic BP Percentile --      Pulse Rate 01/12/24 1239 65     Resp 01/12/24 1239 18     Temp 01/12/24 1239 97.9 F (36.6 C)     Temp Source 01/12/24 1239 Oral     SpO2 01/12/24 1239 97 %     Weight --      Height --      Head Circumference --      Peak Flow --      Pain Score 01/12/24 1242 0     Pain Loc --      Pain Education --      Exclude from Growth Chart --    No data found.  Updated Vital Signs BP 112/68 (BP Location: Left Arm)   Pulse 65   Temp 97.9 F (36.6 C) (Oral)   Resp 18   SpO2 97%   Visual Acuity Right Eye Distance:   Left Eye Distance:   Bilateral Distance:    Right Eye Near:   Left Eye Near:    Bilateral Near:     Physical Exam Vitals and nursing note reviewed.  Constitutional:      General: He is not in acute distress.    Appearance: He is well-developed.  HENT:     Head: Normocephalic and atraumatic.  Eyes:     Conjunctiva/sclera: Conjunctivae normal.  Cardiovascular:     Rate and Rhythm: Normal rate and regular rhythm.     Heart sounds: No murmur heard. Pulmonary:     Effort: Pulmonary effort is normal. No respiratory distress.     Breath sounds: Normal breath sounds.  Abdominal:     Palpations: Abdomen is soft.     Tenderness: There is no abdominal tenderness.  Musculoskeletal:        General: No swelling.     Cervical back: Neck supple.  Skin:    General: Skin is warm and dry.     Capillary Refill: Capillary refill takes less than 2 seconds.     Findings: Rash present.  Rash is crusting.         Comments: Bilateral upper and lower arms with urticarial type rash, some scaling and crusting present as well  Neurological:  Mental Status: He is alert.  Psychiatric:        Mood and Affect: Mood normal.      UC Treatments / Results  Labs (all labs ordered are listed, but only abnormal results are displayed) Labs Reviewed - No data to display  EKG   Radiology No results found.  Procedures Procedures (including critical care time)  Medications Ordered in UC Medications - No data to display  Initial Impression / Assessment and Plan / UC Course  I have reviewed the triage vital signs and the nursing notes.  Pertinent labs & imaging results that were available during my care of the patient were reviewed by me and considered in my medical decision making (see chart for details).     Allergic contact dermatitis due to cosmetics   Symptoms and physical exam findings are most consistent with an allergic reaction to a cosmetic product.  This is called contact dermatitis.  Due to the progressive worsening we will treat with both topical steroids and steroids by mouth.  We will treat with the following: Prednisone  40 mg (2 tablets) once daily for 5 days. Take this in the morning.  This is a steroid to help with inflammation and pain. Triamcinolone cream twice daily to the affected area twice daily for 7 days for itching/rash.  Do not apply this to the neck or face.  Use mild soap and avoid scented products on the skin Return to urgent care or PCP if symptoms worsen or fail to resolve.    Final Clinical Impressions(s) / UC Diagnoses   Final diagnoses:  Allergic contact dermatitis due to cosmetics     Discharge Instructions      Symptoms and physical exam findings are most consistent with an allergic reaction to a cosmetic product.  This is called contact dermatitis.  Due to the progressive worsening we will treat with both topical steroids and  steroids by mouth.  We will treat with the following: Prednisone  40 mg (2 tablets) once daily for 5 days. Take this in the morning.  This is a steroid to help with inflammation and pain. Triamcinolone cream twice daily to the affected area twice daily for 7 days for itching/rash.  Do not apply this to the neck or face.  Use mild soap and avoid scented products on the skin Return to urgent care or PCP if symptoms worsen or fail to resolve.      ED Prescriptions     Medication Sig Dispense Auth. Provider   triamcinolone cream (KENALOG) 0.1 % Apply 1 Application topically 2 (two) times daily. For 7 days 80 g Shaelyn Decarli A, PA-C   predniSONE  (DELTASONE ) 20 MG tablet Take 2 tablets (40 mg total) by mouth daily with breakfast for 5 days. 10 tablet Teresa Almarie LABOR, [place]      PDMP not reviewed this encounter.   Teresa Almarie LABOR, PA-C 01/12/24 1259
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
## 2024-01-16 ENCOUNTER — Ambulatory Visit (HOSPITAL_BASED_OUTPATIENT_CLINIC_OR_DEPARTMENT_OTHER): Admitting: Psychiatry

## 2024-01-16 DIAGNOSIS — F4310 Post-traumatic stress disorder, unspecified: Secondary | ICD-10-CM

## 2024-01-16 DIAGNOSIS — F639 Impulse disorder, unspecified: Secondary | ICD-10-CM | POA: Diagnosis not present

## 2024-01-16 DIAGNOSIS — F12980 Cannabis use, unspecified with anxiety disorder: Secondary | ICD-10-CM | POA: Diagnosis not present

## 2024-01-16 DIAGNOSIS — F79 Unspecified intellectual disabilities: Secondary | ICD-10-CM
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
## 2024-02-25 ENCOUNTER — Ambulatory Visit (HOSPITAL_COMMUNITY): Admitting: Psychiatry

## 2024-02-25 DIAGNOSIS — F79 Unspecified intellectual disabilities: Secondary | ICD-10-CM | POA: Diagnosis not present

## 2024-02-25 DIAGNOSIS — F4310 Post-traumatic stress disorder, unspecified: Secondary | ICD-10-CM | POA: Diagnosis not present

## 2024-02-25 DIAGNOSIS — F639 Impulse disorder, unspecified: Secondary | ICD-10-CM

## 2024-02-25 DIAGNOSIS — F12980 Cannabis use, unspecified with anxiety disorder: Secondary | ICD-10-CM | POA: Diagnosis not present

## 2024-02-25 MED ORDER — TRAZODONE HCL 50 MG PO TABS: 200.0000 mg | ORAL_TABLET | Freq: Every day | ORAL | 2 refills | Status: AC

## 2024-02-25 MED ORDER — LURASIDONE HCL 60 MG PO TABS: 60.0000 mg | ORAL_TABLET | Freq: Every day | ORAL | 2 refills | Status: AC

## 2024-02-25 MED ORDER — HYDROXYZINE HCL 25 MG PO TABS: 25.0000 mg | ORAL_TABLET | Freq: Two times a day (BID) | ORAL | 2 refills | Status: AC | PRN

## 2024-02-25 MED ORDER — DIVALPROEX SODIUM ER 500 MG PO TB24: 2000.0000 mg | ORAL_TABLET | Freq: Every day | ORAL | 2 refills | Status: AC

## 2024-02-25 NOTE — Addendum Note (Signed)
 Addended by: CARVIN CROCK on: 02/25/2024 03:16 PM   Modules accepted: Level of Service

## 2024-03-21 IMAGING — DX DG CHEST 2V
2 series · 2 of 2 positions shown · non-contrast
Comparison: 09/03/2014

CLINICAL DATA: Chest pain

EXAM:
CHEST - 2 VIEW

[w chest pa]
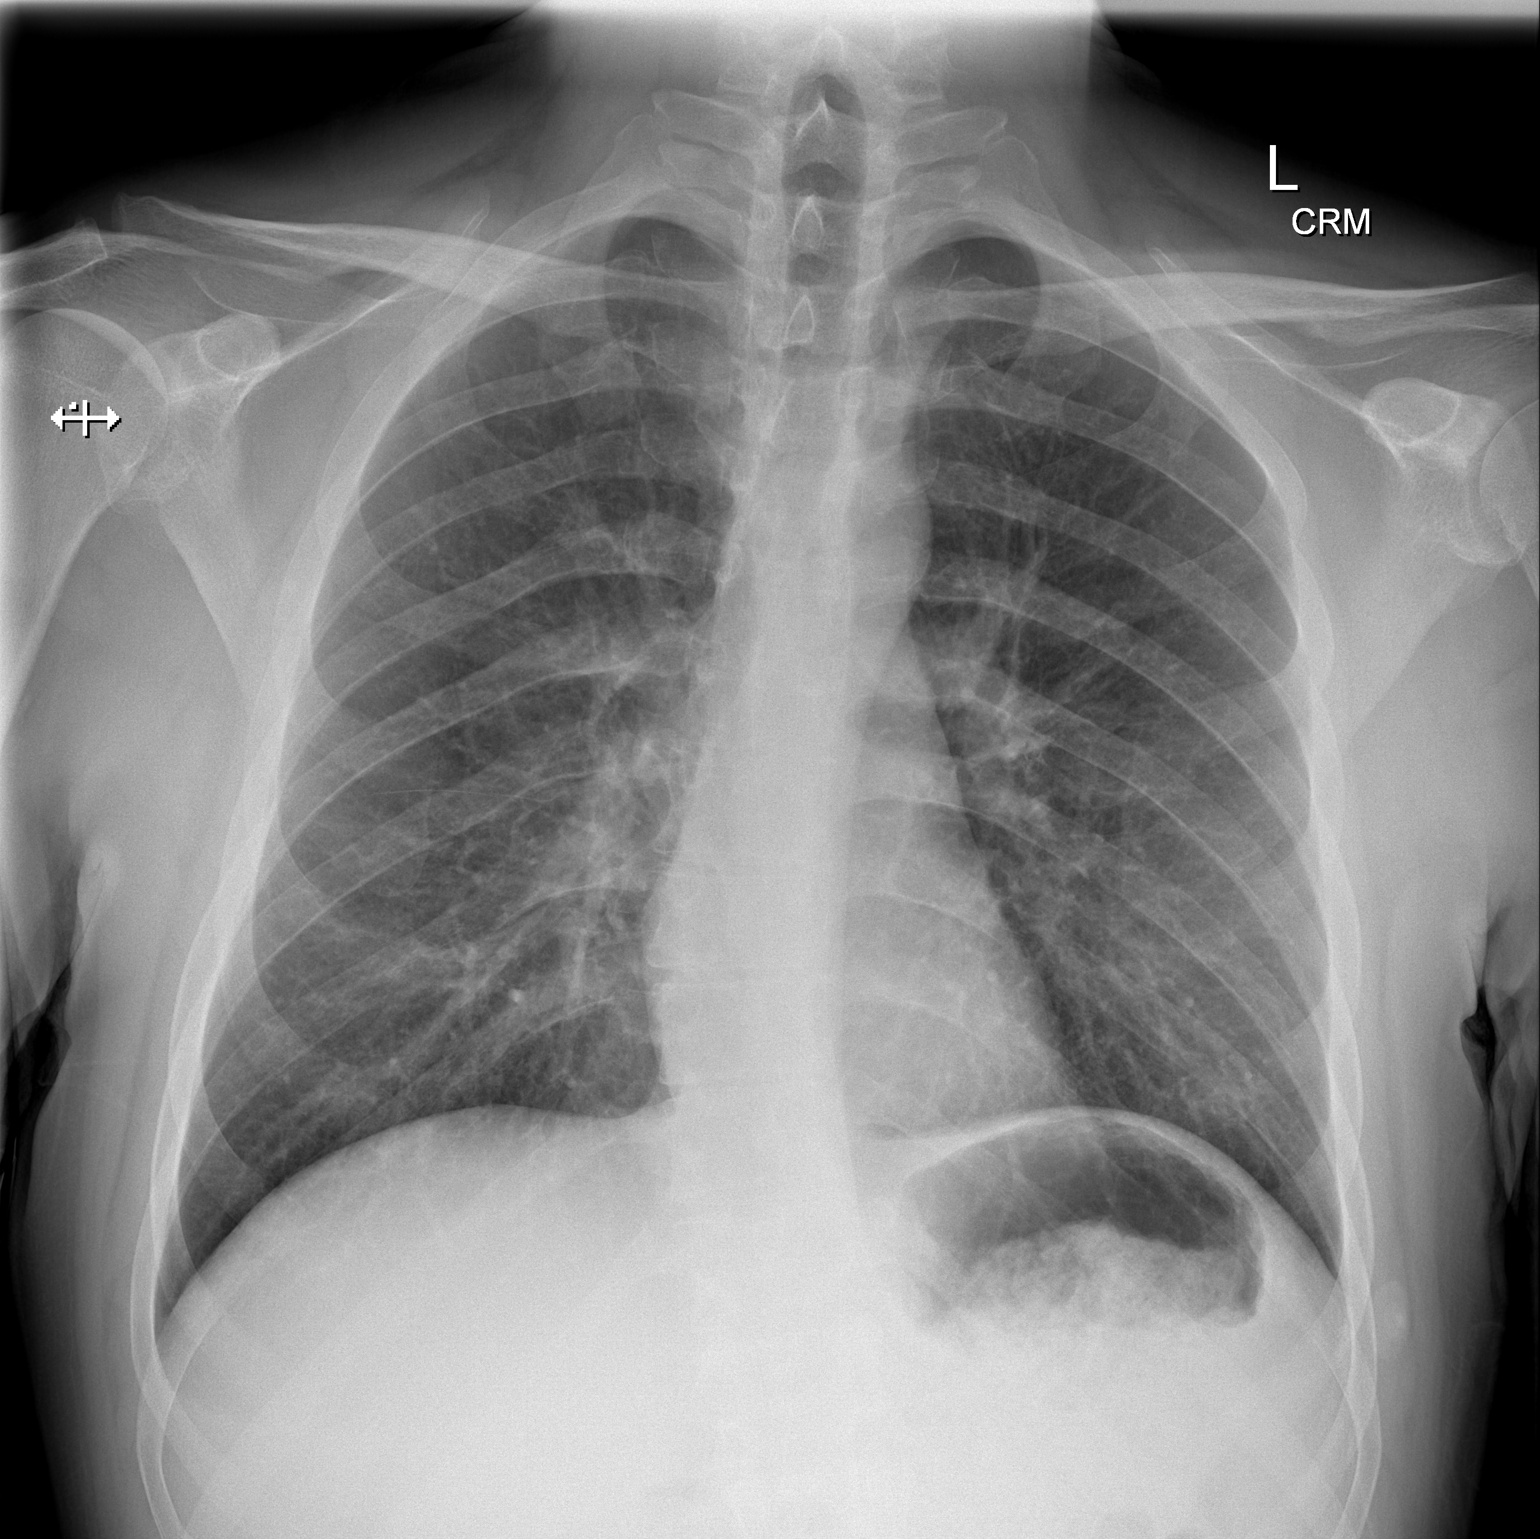

[w chest lat]
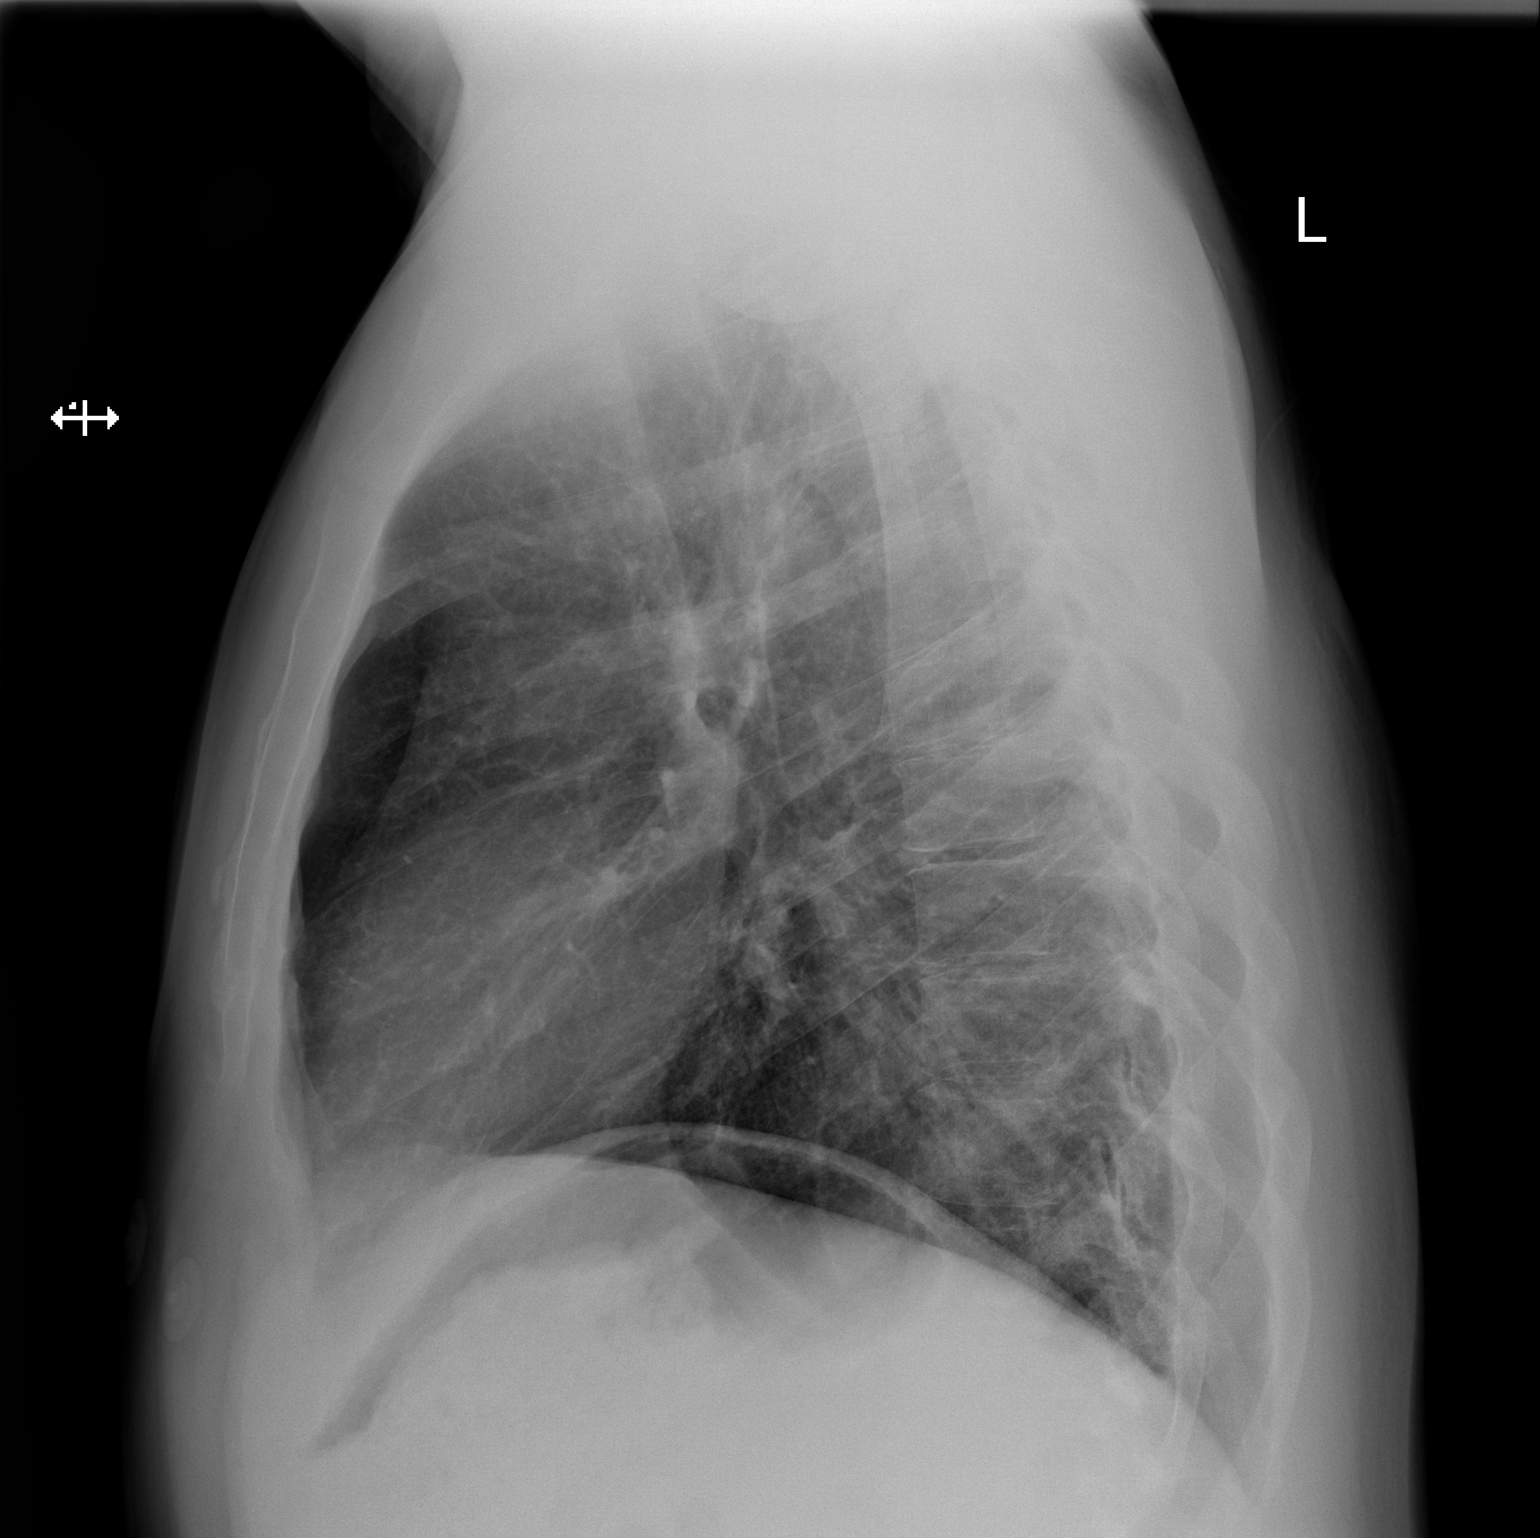

[2 of 2 positions shown; findings below may reference images not displayed]

FINDINGS: The heart size and mediastinal contours are within normal limits.
Both lungs are clear. The visualized skeletal structures are
unremarkable.
IMPRESSION: No active cardiopulmonary disease.

## 2024-04-30 ENCOUNTER — Ambulatory Visit (HOSPITAL_COMMUNITY): Admitting: Psychiatry
